# Patient Record
Sex: Male | Born: 1950
Health system: Southern US, Community
[De-identification: ages and names within clinical notes are randomized; demographics above are authoritative.]

## PROBLEM LIST (undated history)

## (undated) DIAGNOSIS — E7800 Pure hypercholesterolemia, unspecified: Secondary | ICD-10-CM

## (undated) DIAGNOSIS — I10 Essential (primary) hypertension: Secondary | ICD-10-CM

## (undated) DIAGNOSIS — R002 Palpitations: Secondary | ICD-10-CM

## (undated) DIAGNOSIS — M1990 Unspecified osteoarthritis, unspecified site: Secondary | ICD-10-CM

## (undated) HISTORY — DX: Unspecified osteoarthritis, unspecified site: M19.90

## (undated) HISTORY — DX: Palpitations: R00.2

## (undated) HISTORY — PX: NO PAST SURGERIES: SHX2092

## (undated) HISTORY — DX: Essential (primary) hypertension: I10

---

## 2001-01-15 ENCOUNTER — Encounter: Payer: Self-pay | Admitting: *Deleted

## 2001-01-15 ENCOUNTER — Emergency Department (HOSPITAL_COMMUNITY): Admission: EM | Admit: 2001-01-15 | Discharge: 2001-01-15 | Payer: Self-pay | Admitting: *Deleted

## 2001-07-12 ENCOUNTER — Ambulatory Visit (HOSPITAL_COMMUNITY): Admission: RE | Admit: 2001-07-12 | Discharge: 2001-07-12 | Payer: Self-pay | Admitting: Family Medicine

## 2001-07-12 ENCOUNTER — Encounter: Payer: Self-pay | Admitting: Family Medicine

## 2010-08-30 ENCOUNTER — Emergency Department: Payer: Self-pay | Admitting: Emergency Medicine

## 2012-09-13 ENCOUNTER — Other Ambulatory Visit (HOSPITAL_COMMUNITY): Payer: Self-pay | Admitting: Family Medicine

## 2012-09-13 ENCOUNTER — Ambulatory Visit (HOSPITAL_COMMUNITY)
Admission: RE | Admit: 2012-09-13 | Discharge: 2012-09-13 | Disposition: A | Discharge status: Home / Self Care | Payer: BC Managed Care – PPO | Source: Ambulatory Visit | Attending: Family Medicine | Admitting: Family Medicine

## 2012-09-13 DIAGNOSIS — M542 Cervicalgia: Secondary | ICD-10-CM | POA: Insufficient documentation

## 2012-09-13 DIAGNOSIS — M5030 Other cervical disc degeneration, unspecified cervical region: Secondary | ICD-10-CM | POA: Insufficient documentation

## 2012-10-04 ENCOUNTER — Other Ambulatory Visit (HOSPITAL_COMMUNITY): Payer: Self-pay | Admitting: Family Medicine

## 2012-10-04 DIAGNOSIS — I6523 Occlusion and stenosis of bilateral carotid arteries: Secondary | ICD-10-CM

## 2012-10-10 ENCOUNTER — Ambulatory Visit (HOSPITAL_COMMUNITY)
Admission: RE | Admit: 2012-10-10 | Discharge: 2012-10-10 | Disposition: A | Discharge status: Home / Self Care | Payer: BC Managed Care – PPO | Source: Ambulatory Visit | Attending: Family Medicine | Admitting: Family Medicine

## 2012-10-10 DIAGNOSIS — I6529 Occlusion and stenosis of unspecified carotid artery: Secondary | ICD-10-CM | POA: Insufficient documentation

## 2012-10-10 DIAGNOSIS — I6523 Occlusion and stenosis of bilateral carotid arteries: Secondary | ICD-10-CM

## 2016-02-11 ENCOUNTER — Emergency Department (HOSPITAL_COMMUNITY): Payer: BLUE CROSS/BLUE SHIELD

## 2016-02-11 ENCOUNTER — Encounter (HOSPITAL_COMMUNITY): Payer: Self-pay | Admitting: *Deleted

## 2016-02-11 ENCOUNTER — Emergency Department (HOSPITAL_COMMUNITY)
Admission: EM | Admit: 2016-02-11 | Discharge: 2016-02-11 | Disposition: A | Discharge status: Home / Self Care | Payer: BLUE CROSS/BLUE SHIELD | Attending: Emergency Medicine | Admitting: Emergency Medicine

## 2016-02-11 DIAGNOSIS — R5383 Other fatigue: Secondary | ICD-10-CM | POA: Diagnosis not present

## 2016-02-11 DIAGNOSIS — R110 Nausea: Secondary | ICD-10-CM | POA: Insufficient documentation

## 2016-02-11 DIAGNOSIS — Z79899 Other long term (current) drug therapy: Secondary | ICD-10-CM | POA: Insufficient documentation

## 2016-02-11 DIAGNOSIS — Z87891 Personal history of nicotine dependence: Secondary | ICD-10-CM | POA: Diagnosis not present

## 2016-02-11 DIAGNOSIS — R0789 Other chest pain: Secondary | ICD-10-CM | POA: Insufficient documentation

## 2016-02-11 HISTORY — DX: Pure hypercholesterolemia, unspecified: E78.00

## 2016-02-11 LAB — CBC WITH DIFFERENTIAL/PLATELET
BASOS ABS: 0 10*3/uL (ref 0.0–0.1)
BASOS PCT: 1 %
EOS ABS: 0.2 10*3/uL (ref 0.0–0.7)
EOS PCT: 3 %
HCT: 42.2 % (ref 39.0–52.0)
Hemoglobin: 14.8 g/dL (ref 13.0–17.0)
LYMPHS ABS: 2.2 10*3/uL (ref 0.7–4.0)
Lymphocytes Relative: 37 %
MCH: 32.2 pg (ref 26.0–34.0)
MCHC: 35.1 g/dL (ref 30.0–36.0)
MCV: 91.9 fL (ref 78.0–100.0)
Monocytes Absolute: 0.8 10*3/uL (ref 0.1–1.0)
Monocytes Relative: 13 %
NEUTROS PCT: 46 %
Neutro Abs: 2.7 10*3/uL (ref 1.7–7.7)
PLATELETS: 250 10*3/uL (ref 150–400)
RBC: 4.59 MIL/uL (ref 4.22–5.81)
RDW: 13.2 % (ref 11.5–15.5)
WBC: 5.8 10*3/uL (ref 4.0–10.5)

## 2016-02-11 LAB — BASIC METABOLIC PANEL
ANION GAP: 5 (ref 5–15)
BUN: 14 mg/dL (ref 6–20)
CALCIUM: 8.6 mg/dL — AB (ref 8.9–10.3)
CHLORIDE: 106 mmol/L (ref 101–111)
CO2: 25 mmol/L (ref 22–32)
CREATININE: 0.93 mg/dL (ref 0.61–1.24)
GFR calc non Af Amer: >60 mL/min (ref >60)
GLUCOSE: 104 mg/dL — AB (ref 65–99)
Potassium: 3.8 mmol/L (ref 3.5–5.1)
SODIUM: 136 mmol/L (ref 135–145)

## 2016-02-11 LAB — TROPONIN I
Troponin I: <0.03 ng/mL (ref <0.03)
Troponin I: <0.03 ng/mL (ref <0.03)

## 2016-02-11 MED ORDER — ASPIRIN 325 MG PO TABS
325.0000 mg | ORAL_TABLET | Freq: Once | ORAL | Status: DC
  Filled 2016-02-11: qty 1

## 2016-02-11 MED ORDER — HYDROCODONE-ACETAMINOPHEN 5-325 MG PO TABS: 1.0000 | ORAL_TABLET | Freq: Four times a day (QID) | ORAL | 0 refills | Status: DC | PRN

## 2016-02-11 MED ORDER — GI COCKTAIL ~~LOC~~
30.0000 mL | Freq: Once | ORAL | Status: AC
  Administered 2016-02-11: 30 mL via ORAL
  Filled 2016-02-11: qty 30

## 2016-02-11 MED ORDER — NITROGLYCERIN 2 % TD OINT
1.0000 [in_us] | TOPICAL_OINTMENT | Freq: Once | TRANSDERMAL | Status: AC
  Administered 2016-02-11: 1 [in_us] via TOPICAL
  Filled 2016-02-11: qty 1

## 2016-02-11 NOTE — ED Provider Notes (Signed)
Patient complains of left posterior shoulder pain. EKG and troponin 2 negative. Discharge medication Norco. He has primary care follow-up.   Donnetta HutchingBrian Kynzie Polgar, MD 02/11/16 1037

## 2016-02-11 NOTE — ED Triage Notes (Addendum)
Pt c/o left shoulder pain that woke him up x 4 hours ago; pt states he has pain in his chest and feels weak; pt c/o some nausea; pt c/o pain to big toe that radiates up is leg

## 2016-02-11 NOTE — ED Provider Notes (Signed)
[facility] Provider Note   CSN: [id number] Arrival date & time: 02/11/16  0511     History   Chief Complaint Chief Complaint  Patient presents with  . Chest Pain    HPI Anola GurneyGodofredo Abdulaziz is a 65 y.o. male.  HPI  This is a 65 year old male with a history of high cholesterol who presents with chest pain and nausea. Patient reports he woke up with pain approximately 4 hours ago. He reports chest pressure and pain between his shoulder blades. Current pain is 5 out of 10. Nothing seems to make it better or worse. Denies any recent illnesses, fevers, cough. Denies any aggravating or alleviating factors. He has had pain like this once before but "it went away on its own." No exertional component. Denies history of smoking, hypertension. Reports his brother died of a heart attack in his 6870s.  Denies shortness breath or diaphoresis  Past Medical History:  Diagnosis Date  . High cholesterol     There are no active problems to display for this patient.   History reviewed. No pertinent surgical history.     Home Medications    Prior to Admission medications   Medication Sig Start Date End Date Taking? Authorizing Provider  LORazepam (ATIVAN) 1 MG tablet Take 1 mg by mouth every 8 (eight) hours as needed for anxiety or sleep.   Yes Historical Provider, MD  LOVASTATIN PO Take by mouth.   Yes Historical Provider, MD    Family History History reviewed. No pertinent family history.  Social History Social History  Substance Use Topics  . Smoking status: Former Games developermoker  . Smokeless tobacco: Never Used  . Alcohol use Not on file     Comment: on weekends     Allergies   Aspirin   Review of Systems Review of Systems  Constitutional: Positive for fatigue. Negative for fever.  Respiratory: Negative for shortness of breath.   Cardiovascular: Positive for chest pain. Negative for leg swelling.  Gastrointestinal: Positive for nausea. Negative for abdominal pain and  vomiting.  Neurological: Negative for weakness.  All other systems reviewed and are negative.    Physical Exam Updated Vital Signs BP 137/93 (BP Location: Left Arm)   Pulse 68   Temp 98.2 F (36.8 C) (Oral)   Resp 23   Ht 5\' 4"  (1.626 m)   Wt 180 lb (81.6 kg)   SpO2 97%   BMI 30.90 kg/m   Physical Exam  Constitutional: He is oriented to person, place, and time. He appears well-developed and well-nourished. No distress.  HENT:  Head: Normocephalic and atraumatic.  Cardiovascular: Normal rate, regular rhythm and normal heart sounds.   No murmur heard. Pulmonary/Chest: Effort normal and breath sounds normal. No respiratory distress. He has no wheezes.  Abdominal: Soft. Bowel sounds are normal. There is no tenderness. There is no rebound.  Musculoskeletal: He exhibits no edema.  Neurological: He is alert and oriented to person, place, and time.  Skin: Skin is warm and dry.  Psychiatric: He has a normal mood and affect.  Nursing note and vitals reviewed.    ED Treatments / Results  Labs (all labs ordered are listed, but only abnormal results are displayed) Labs Reviewed  BASIC METABOLIC PANEL - Abnormal; Notable for the following:       Result Value   Glucose, Bld 104 (*)    Calcium 8.6 (*)    All other components within normal limits  CBC WITH DIFFERENTIAL/PLATELET  TROPONIN I    EKG  EKG Interpretation  Date/Time:  Tuesday February 11 2016 05:27:34 EDT Ventricular Rate:  69 PR Interval:    QRS Duration: 94 QT Interval:  380 QTC Calculation: 408 R Axis:   -5 Text Interpretation:  Sinus rhythm No prior for comparison Confirmed by Wilkie Aye  MD, Melenie Minniear ([id number]) on 02/11/2016 6:18:08 AM       Radiology Dg Chest 2 View  Result Date: 02/11/2016 CLINICAL DATA:  Chest pain. Awoke with left shoulder pain. Weakness. EXAM: CHEST  2 VIEW COMPARISON:  None. FINDINGS: Lung volumes are low with bibasilar atelectasis. Heart at the upper limits normal in size, likely  accentuated by low lung volumes. No pulmonary edema, pleural effusion or pneumothorax. No acute osseous abnormality. IMPRESSION: Hypoventilatory chest with bibasilar atelectasis. Electronically Signed   By: Rubye Oaks M.D.   On: 02/11/2016 06:24    Procedures Procedures (including critical care time)  Medications Ordered in ED Medications  aspirin tablet 325 mg (not administered)  gi cocktail (Maalox,Lidocaine,Donnatal) (not administered)  nitroGLYCERIN (NITROGLYN) 2 % ointment 1 inch (1 inch Topical Given 02/11/16 6045)     Initial Impression / Assessment and Plan / ED Course  I have reviewed the triage vital signs and the nursing notes.  Pertinent labs & imaging results that were available during my care of the patient were reviewed by me and considered in my medical decision making (see chart for details).  Clinical Course    Patient presents with chest pain. Somewhat atypical in nature. EKG is normal. No coronary artery disease history. Heart scores 3. Initial troponin negative. Patient given nitroglycerin and aspirin. He was subsequently also given a GI cocktail.  He has an extensive history of ulcers. Discussed with patient and his family repeat troponin and if negative, follow up with cardiology closely.  Final Clinical Impressions(s) / ED Diagnoses   Final diagnoses:  None    New Prescriptions New Prescriptions   No medications on file     Shon Baton, MD 02/11/16 2254

## 2016-02-11 NOTE — ED Notes (Addendum)
Pt states he can not take ASA due to stomach ulcers. [place] informed

## 2016-02-11 NOTE — Discharge Instructions (Addendum)
Blood test for heart attack negative times two.   Meds for pain.  Follow up your dr

## 2016-05-17 ENCOUNTER — Encounter (HOSPITAL_COMMUNITY): Payer: Self-pay | Admitting: Emergency Medicine

## 2016-05-17 ENCOUNTER — Emergency Department (HOSPITAL_COMMUNITY)
Admission: EM | Admit: 2016-05-17 | Discharge: 2016-05-17 | Disposition: A | Discharge status: Home / Self Care | Payer: BLUE CROSS/BLUE SHIELD | Attending: Emergency Medicine | Admitting: Emergency Medicine

## 2016-05-17 ENCOUNTER — Emergency Department (HOSPITAL_COMMUNITY): Payer: BLUE CROSS/BLUE SHIELD

## 2016-05-17 DIAGNOSIS — R079 Chest pain, unspecified: Secondary | ICD-10-CM | POA: Diagnosis not present

## 2016-05-17 DIAGNOSIS — Z7982 Long term (current) use of aspirin: Secondary | ICD-10-CM | POA: Insufficient documentation

## 2016-05-17 DIAGNOSIS — Z791 Long term (current) use of non-steroidal anti-inflammatories (NSAID): Secondary | ICD-10-CM | POA: Diagnosis not present

## 2016-05-17 DIAGNOSIS — Z87891 Personal history of nicotine dependence: Secondary | ICD-10-CM | POA: Diagnosis not present

## 2016-05-17 DIAGNOSIS — R002 Palpitations: Secondary | ICD-10-CM | POA: Diagnosis not present

## 2016-05-17 DIAGNOSIS — Z79899 Other long term (current) drug therapy: Secondary | ICD-10-CM | POA: Insufficient documentation

## 2016-05-17 LAB — HEPATIC FUNCTION PANEL
ALT: 44 U/L (ref 17–63)
AST: 37 U/L (ref 15–41)
Albumin: 4 g/dL (ref 3.5–5.0)
Alkaline Phosphatase: 66 U/L (ref 38–126)
BILIRUBIN INDIRECT: 0.6 mg/dL (ref 0.3–0.9)
Bilirubin, Direct: 0.4 mg/dL (ref 0.1–0.5)
TOTAL PROTEIN: 7.3 g/dL (ref 6.5–8.1)
Total Bilirubin: 1 mg/dL (ref 0.3–1.2)

## 2016-05-17 LAB — CBC
HCT: 46 % (ref 39.0–52.0)
Hemoglobin: 15.8 g/dL (ref 13.0–17.0)
MCH: 31.7 pg (ref 26.0–34.0)
MCHC: 34.3 g/dL (ref 30.0–36.0)
MCV: 92.4 fL (ref 78.0–100.0)
PLATELETS: 252 10*3/uL (ref 150–400)
RBC: 4.98 MIL/uL (ref 4.22–5.81)
RDW: 13.9 % (ref 11.5–15.5)
WBC: 5.3 10*3/uL (ref 4.0–10.5)

## 2016-05-17 LAB — BASIC METABOLIC PANEL
Anion gap: 7 (ref 5–15)
BUN: 8 mg/dL (ref 6–20)
CALCIUM: 8.8 mg/dL — AB (ref 8.9–10.3)
CO2: 26 mmol/L (ref 22–32)
CREATININE: 0.95 mg/dL (ref 0.61–1.24)
Chloride: 104 mmol/L (ref 101–111)
GFR calc Af Amer: >60 mL/min (ref >60)
Glucose, Bld: 94 mg/dL (ref 65–99)
Potassium: 4.1 mmol/L (ref 3.5–5.1)
Sodium: 137 mmol/L (ref 135–145)

## 2016-05-17 LAB — TROPONIN I

## 2016-05-17 LAB — LIPASE, BLOOD: Lipase: 16 U/L (ref 11–51)

## 2016-05-17 NOTE — ED Provider Notes (Signed)
[facility] Provider Note   CSN: [id number] Arrival date & time: 05/17/16  1056  By signing my name below, I, Bobbie Stack, attest that this documentation has been prepared under the direction and in the presence of Benjiman Core, MD. Electronically Signed: Bobbie Stack, Scribe. 05/17/16. 11:36 AM. History   Chief Complaint Chief Complaint  Patient presents with  . Chest Pain      The history is provided by the patient. No language interpreter was used.    HPI Comments: Jose Ellis is a 66 y.o. male who presents to the Emergency Department complaining of intermittent chest pain since Friday around 1pm. He thought this may be due to indigestion. He took pepcid and Catering manager which provided mild relief. He reports no change in pain when eating. He doesn't drink caffeine. Patient also reports palpitations that started last night. He states that they occur about every 30 seconds. He states that it feels like his heart skips a beat. Past Medical History:  Diagnosis Date  . High cholesterol     There are no active problems to display for this patient.   History reviewed. No pertinent surgical history.   Home Medications    Prior to Admission medications   Medication Sig Start Date End Date Taking? Authorizing Provider  acetaminophen (TYLENOL) 325 MG tablet Take 650 mg by mouth every 6 (six) hours as needed for mild pain.   Yes Historical Provider, MD  aspirin-sod bicarb-citric acid (ALKA-SELTZER) 325 MG TBEF tablet Take 325 mg by mouth every 6 (six) hours as needed (indegestion).   Yes Historical Provider, MD  HYDROcodone-acetaminophen (NORCO) 5-325 MG tablet Take 1-2 tablets by mouth every 6 (six) hours as needed for moderate pain. 02/11/16  Yes Donnetta Hutching, MD  LORazepam (ATIVAN) 1 MG tablet Take 1 mg by mouth every 8 (eight) hours as needed for anxiety or sleep.   Yes Historical Provider, MD  naproxen sodium (ANAPROX) 220 MG tablet Take 440 mg by mouth 2  (two) times daily with a meal.   Yes Historical Provider, MD  lovastatin (MEVACOR) 40 MG tablet Take 40 mg by mouth daily.     Historical Provider, MD    Family History History reviewed. No pertinent family history.  Social History Social History  Substance Use Topics  . Smoking status: Former Games developer  . Smokeless tobacco: Never Used  . Alcohol use No     Comment: on weekends     Allergies   Aspirin   Review of Systems Review of Systems  Constitutional: Negative for fever.  Cardiovascular: Positive for chest pain and palpitations.  Gastrointestinal: Negative for abdominal pain.  All other systems reviewed and are negative.   Physical Exam Updated Vital Signs BP 124/85   Pulse 71   Temp 98.1 F (36.7 C) (Oral)   Resp 20   Ht 5\' 4"  (1.626 m)   Wt 190 lb (86.2 kg)   SpO2 96%   BMI 32.61 kg/m   Physical Exam  Constitutional: He is oriented to person, place, and time. He appears well-nourished. No distress.  HENT:  Head: Normocephalic and atraumatic.  Eyes: Conjunctivae and EOM are normal. Pupils are equal, round, and reactive to light.  Neck: Normal range of motion. Neck supple. No JVD present.  Cardiovascular: Normal rate.  Exam reveals no gallop and no friction rub.   No murmur heard. Pulmonary/Chest: Effort normal and breath sounds normal. He has no wheezes. He has no rales.  Abdominal: Soft. Bowel sounds are normal. There  is no tenderness.  Musculoskeletal: Normal range of motion. He exhibits no tenderness.  Neurological: He is alert and oriented to person, place, and time.  Skin: Skin is warm and dry.  Psychiatric: He has a normal mood and affect.    ED Treatments / Results  DIAGNOSTIC STUDIES: Oxygen Saturation is 98% on RA, normal by my interpretation.    COORDINATION OF CARE: 11:40 AM Discussed treatment plan with pt at bedside and pt agreed to plan.  Labs (all labs ordered are listed, but only abnormal results are displayed) Labs Reviewed  BASIC  METABOLIC PANEL - Abnormal; Notable for the following:       Result Value   Calcium 8.8 (*)    All other components within normal limits  CBC  TROPONIN I  HEPATIC FUNCTION PANEL  LIPASE, BLOOD    EKG  EKG Interpretation  Date/Time:  Sunday May 17 2016 11:09:09 EST Ventricular Rate:  68 PR Interval:    QRS Duration: 92 QT Interval:  397 QTC Calculation: 423 R Axis:   -18 Text Interpretation:  Sinus rhythm Borderline left axis deviation Confirmed by Rubin PayorPICKERING  MD, Harrold DonathNATHAN [id number]) on 05/17/2016 12:02:46 PM       Radiology Dg Chest 2 View  Result Date: 05/17/2016 CLINICAL DATA:  Chest pain and palpitations since last night, former smoker EXAM: CHEST  2 VIEW COMPARISON:  02/11/2016 FINDINGS: Upper normal heart size. Mediastinal contours and pulmonary vascularity normal. Eventration of anterior RIGHT diaphragm stable. No acute infiltrate, pleural effusion, or pneumothorax. Bones unremarkable. IMPRESSION: No acute abnormalities. Electronically Signed   By: Ulyses SouthwardMark  Boles M.D.   On: 05/17/2016 12:09    Procedures Procedures (including critical care time)  Medications Ordered in ED Medications - No data to display   Initial Impression / Assessment and Plan / ED Course  I have reviewed the triage vital signs and the nursing notes.  Pertinent labs & imaging results that were available during my care of the patient were reviewed by me and considered in my medical decision making (see chart for details).  Clinical Course     Patient presents with palpitations and chest pain. Has been looked at before without cause. Has dull chest pain. EKG and lab work reassuring. Did have some occasional PVCs on EKG. Labs reassuring. X-ray reassuring. Discharge home to follow-up with PCP.  Final Clinical Impressions(s) / ED Diagnoses   Final diagnoses:  Nonspecific chest pain  Palpitations    New Prescriptions New Prescriptions   No medications on file   I personally performed the  services described in this documentation, which was scribed in my presence. The recorded information has been reviewed and is accurate.      Benjiman CoreNathan Jamilynn Whitacre, MD 05/17/16 [phone]

## 2016-05-17 NOTE — ED Triage Notes (Signed)
Pt states last night he began having palpitations intermittently. Hx of this before and was evaluated did not find a source. States chest "discomfort".

## 2016-06-26 ENCOUNTER — Ambulatory Visit (INDEPENDENT_AMBULATORY_CARE_PROVIDER_SITE_OTHER): Payer: BLUE CROSS/BLUE SHIELD | Admitting: Cardiology

## 2016-06-26 ENCOUNTER — Encounter: Payer: Self-pay | Admitting: Cardiology

## 2016-06-26 VITALS — BP 110/76 | HR 67 | Ht 65.0 in | Wt 194.0 lb

## 2016-06-26 DIAGNOSIS — R002 Palpitations: Secondary | ICD-10-CM

## 2016-06-26 NOTE — Progress Notes (Signed)
Clinical Summary Jose Ellis is a 66 y.o.male seen as a new patient. He is referred by Dr Jose Ellis.   1. Chest pain - reports 2 total episodes of chest pain and palpitations over the last few mwonths.  -most recently seen in ER 05/17/16 for symptoms.  - symptoms started around 11pm feeling discomfort left chest, heaviness left sided. Mild pain. Mild SOB, +palpitations. - seen by nurse friend, thought to be having an irregular rhythm -symptoms worst with iced tea.  - in ER noted to have some occasional PVCs     SH: husband of Jose Ellis, also a patient of mine   Past Medical History:  Diagnosis Date  . High cholesterol      Allergies  Allergen Reactions  . Aspirin     Pt states it burns his stomach     Current Outpatient Prescriptions  Medication Sig Dispense Refill  . acetaminophen (TYLENOL) 325 MG tablet Take 650 mg by mouth every 6 (six) hours as needed for mild pain.    [name] aspirin-sod bicarb-citric acid (ALKA-SELTZER) 325 MG TBEF tablet Take 325 mg by mouth every 6 (six) hours as needed (indegestion).    [name] HYDROcodone-acetaminophen (NORCO) 5-325 MG tablet Take 1-2 tablets by mouth every 6 (six) hours as needed for moderate pain. 20 tablet 0  . LORazepam (ATIVAN) 1 MG tablet Take 1 mg by mouth every 8 (eight) hours as needed for anxiety or sleep.    [name] lovastatin (MEVACOR) 40 MG tablet Take 40 mg by mouth daily.     . naproxen sodium (ANAPROX) 220 MG tablet Take 440 mg by mouth 2 (two) times daily with a meal.     No current facility-administered medications for this visit.      No past surgical history on file.   Allergies  Allergen Reactions  . Aspirin     Pt states it burns his stomach      No family history on file.   Social History [name] reports that he has quit smoking. He has never used smokeless tobacco. [name] reports that he does not drink alcohol.   Review of Systems CONSTITUTIONAL: No weight loss, fever, chills,  weakness or fatigue.  HEENT: Eyes: No visual loss, blurred vision, double vision or yellow sclerae.No hearing loss, sneezing, congestion, runny nose or sore throat.  SKIN: No rash or itching.  CARDIOVASCULAR: per HPI RESPIRATORY: No shortness of breath, cough or sputum.  GASTROINTESTINAL: No anorexia, nausea, vomiting or diarrhea. No abdominal pain or blood.  GENITOURINARY: No burning on urination, no polyuria NEUROLOGICAL: No headache, dizziness, syncope, paralysis, ataxia, numbness or tingling in the extremities. No change in bowel or bladder control.  MUSCULOSKELETAL: No muscle, back pain, joint pain or stiffness.  LYMPHATICS: No enlarged nodes. No history of splenectomy.  PSYCHIATRIC: No history of depression or anxiety.  ENDOCRINOLOGIC: No reports of sweating, cold or heat intolerance. No polyuria or polydipsia.  [name]   Physical Examination Vitals:   06/26/16 1342 06/26/16 1345  BP: 102/70 110/76  Pulse: 67    Vitals:   06/26/16 1342  Weight: 194 lb (88 kg)  Height: 5\' 5"  (1.651 m)    Gen: resting comfortably, no acute distress HEENT: no scleral icterus, pupils equal round and reactive, no palptable cervical adenopathy,  CV: RRR, no m/r/g, no jvd Resp: Clear to auscultation bilaterally GI: abdomen is soft, non-tender, non-distended, normal bowel sounds, no hepatosplenomegaly MSK: extremities are warm, no edema.  Skin: warm, no rash Neuro:  no focal deficits Psych: appropriate affect     Assessment and Plan  1. Chest pain/palpitations - symptoms most consistent with symptomatic arrhythmia. EKG in clinic today SR.  - we will obtain an event monitor to further evaluate - counseled to low caffeine intake.    F/u 1 month   Antoine PocheJonathan F. Ailen Strauch, M.D.

## 2016-06-26 NOTE — Patient Instructions (Signed)
Your physician recommends that you schedule a follow-up appointment in: 1 month    Your physician has recommended that you wear an event monitor. Event monitors are medical devices that record the heart's electrical activity. Doctors most often us these monitors to diagnose arrhythmias. Arrhythmias are problems with the speed or rhythm of the heartbeat. The monitor is a small, portable device. You can wear one while you do your normal daily activities. This is usually used to diagnose what is causing palpitations/syncope (passing out).     Your physician recommends that you continue on your current medications as directed. Please refer to the Current Medication list given to you today.     Thank you for choosing [facility] !

## 2016-07-22 ENCOUNTER — Telehealth: Payer: Self-pay | Admitting: Cardiology

## 2016-07-22 NOTE — Telephone Encounter (Signed)
Pt's daughter wanted results for cardiac event monitor. Was mailed back last week. Let her know as soon as the results are made available I would let her know. I will call [employer] to see the delay in the end of service.

## 2016-07-22 NOTE — Telephone Encounter (Signed)
Results of event monitor / tg  °

## 2016-07-23 NOTE — Telephone Encounter (Signed)
Spoke with niece. Pt will come in tomorrow and have monitor placed by office staff.

## 2016-07-23 NOTE — Telephone Encounter (Signed)
Monitor needs to be rearranged. Please explain to patient and his daughter what happened   Dominga FerryJ Branch MD

## 2016-07-23 NOTE — Telephone Encounter (Signed)
Spoke with [employer]. They state they were unable to reach pt after multiple tries in order to obtain a baseline. Following their protocol [employer] then canceled the monitoring services. Pt has since returned the monitor.

## 2016-07-24 ENCOUNTER — Encounter (INDEPENDENT_AMBULATORY_CARE_PROVIDER_SITE_OTHER): Payer: BLUE CROSS/BLUE SHIELD

## 2016-07-24 DIAGNOSIS — R002 Palpitations: Secondary | ICD-10-CM

## 2016-08-12 ENCOUNTER — Telehealth: Payer: Self-pay | Admitting: Cardiology

## 2016-08-12 NOTE — Telephone Encounter (Signed)
Patient's daughter wants to confirm that we received resultsof patient's monitor. / tg

## 2016-08-14 ENCOUNTER — Encounter: Payer: Self-pay | Admitting: Cardiology

## 2016-08-14 ENCOUNTER — Ambulatory Visit (INDEPENDENT_AMBULATORY_CARE_PROVIDER_SITE_OTHER): Payer: BLUE CROSS/BLUE SHIELD | Admitting: Cardiology

## 2016-08-14 VITALS — BP 100/60 | HR 64 | Ht 65.0 in | Wt 186.0 lb

## 2016-08-14 DIAGNOSIS — R002 Palpitations: Secondary | ICD-10-CM

## 2016-08-14 DIAGNOSIS — R0789 Other chest pain: Secondary | ICD-10-CM | POA: Diagnosis not present

## 2016-08-14 MED ORDER — METOPROLOL TARTRATE 25 MG PO TABS: 12.5000 mg | ORAL_TABLET | Freq: Two times a day (BID) | ORAL | 3 refills | Status: DC

## 2016-08-14 NOTE — Patient Instructions (Signed)
Medication Instructions:  Start lopressor 12.5 mg every 12 hours as needed for palpitations  Labwork: none  Testing/Procedures: none  Follow-Up: Your physician wants you to follow-up in: 6 months .  You will receive a reminder letter in the mail two months in advance. If you don't receive a letter, please call our office to schedule the follow-up appointment.   Any Other Special Instructions Will Be Listed Below (If Applicable).     If you need a refill on your cardiac medications before your next appointment, please call your pharmacy.

## 2016-08-14 NOTE — Progress Notes (Signed)
Clinical Summary Jose Ellis is a 66 y.o.male seen today for follow up of the following medical problems.   1. Chest pain - reports 2 total episodes of chest pain and palpitations over the last few mwonths.  -most recently seen in ER 05/17/16 for symptoms.  - symptoms started around 11pm feeling discomfort left chest, heaviness left sided. Mild pain. Mild SOB, +palpitations. - seen by nurse friend, thought to be having an irregular rhythm -symptoms worst with iced tea.  - in ER noted to have some occasional PVCs - better - episode occurred after cutting grass. Difficult to describe pain, mild in severity. Lasted about 30 minutes. Can occur after drinking caffeine. Better with ativan. Can be stensitive to palpation  CAD risk fctorss; HL, former smoker x 25 years,  quit 20 years, brother MI 65 - since last visit completed heart monitor that was benign.     SH: husband of Jose Ellis, also a patient of mine Past Medical History:  Diagnosis Date  . Arthritis   . High cholesterol      Allergies  Allergen Reactions  . Aspirin     Pt states it burns his stomach     Current Outpatient Prescriptions  Medication Sig Dispense Refill  . acetaminophen (TYLENOL) 325 MG tablet Take 650 mg by mouth every 6 (six) hours as needed for mild pain.    [name] aspirin-sod bicarb-citric acid (ALKA-SELTZER) 325 MG TBEF tablet Take 325 mg by mouth every 6 (six) hours as needed (indegestion).    [name] HYDROcodone-acetaminophen (NORCO) 5-325 MG tablet Take 1-2 tablets by mouth every 6 (six) hours as needed for moderate pain. 20 tablet 0  . LORazepam (ATIVAN) 1 MG tablet Take 1 mg by mouth every 8 (eight) hours as needed for anxiety or sleep.    [name] lovastatin (MEVACOR) 40 MG tablet Take 40 mg by mouth daily.     . naproxen sodium (ANAPROX) 220 MG tablet Take 440 mg by mouth 2 (two) times daily with a meal.     No current facility-administered medications for this visit.      No past surgical history  on file.   Allergies  Allergen Reactions  . Aspirin     Pt states it burns his stomach      Family History  Problem Relation Age of Onset  . Heart attack Brother      Social History [name] reports that he has quit smoking. He has never used smokeless tobacco. [name] reports that he does not drink alcohol.   Review of Systems CONSTITUTIONAL: No weight loss, fever, chills, weakness or fatigue.  HEENT: Eyes: No visual loss, blurred vision, double vision or yellow sclerae.No hearing loss, sneezing, congestion, runny nose or sore throat.  SKIN: No rash or itching.  CARDIOVASCULAR: per HPI RESPIRATORY: No shortness of breath, cough or sputum.  GASTROINTESTINAL: No anorexia, nausea, vomiting or diarrhea. No abdominal pain or blood.  GENITOURINARY: No burning on urination, no polyuria NEUROLOGICAL: No headache, dizziness, syncope, paralysis, ataxia, numbness or tingling in the extremities. No change in bowel or bladder control.  MUSCULOSKELETAL: No muscle, back pain, joint pain or stiffness.  LYMPHATICS: No enlarged nodes. No history of splenectomy.  PSYCHIATRIC: No history of depression or anxiety.  ENDOCRINOLOGIC: No reports of sweating, cold or heat intolerance. No polyuria or polydipsia.  [name]   Physical Examination Vitals:   08/14/16 1300 08/14/16 1310  BP: (!) 83/58 100/60  Pulse: 62 64   Vitals:  08/14/16 1300  Weight: 186 lb (84.4 kg)  Height:  (1.651 m)    Gen: resting comfortably, no acute distress HEENT: no scleral icterus, pupils equal round and reactive, no palptable cervical adenopathy,  CV: RRR, no m/rg, no jvd Resp: Clear to auscultation bilaterally GI: abdomen is soft, non-tender, non-distended, normal bowel sounds, no hepatosplenomegaly MSK: extremities are warm, no edema.  Skin: warm, no rash Neuro:  no focal deficits Psych: appropriate affect   Diagnostic Studies  07/2016 Event monitor  Telemetry tracings show sinus rhythm  Min  HR 43, Max HR 113, Avg HR 63  No symptoms reported  No significant arrhythmias   Assessment and Plan   1. Chest pain/palpitations - negative event monitor. Very atypical chest pain. Given his CAD risk factors we discussed a possible stress test however he is not in favor at this time. Given how atypical his symptoms are I think this is reasonable decision, and something to just monitor at this time - start prn lopressor for palpitations.   F/u 6 months.       Antoine Poche, M.D.

## 2017-01-27 IMAGING — DX DG CHEST 2V
2 series · 2 of 2 positions shown · non-contrast
Comparison: 02/11/2016

CLINICAL DATA: Chest pain and palpitations since last night, former
smoker

EXAM:
CHEST  2 VIEW

[chest pa]
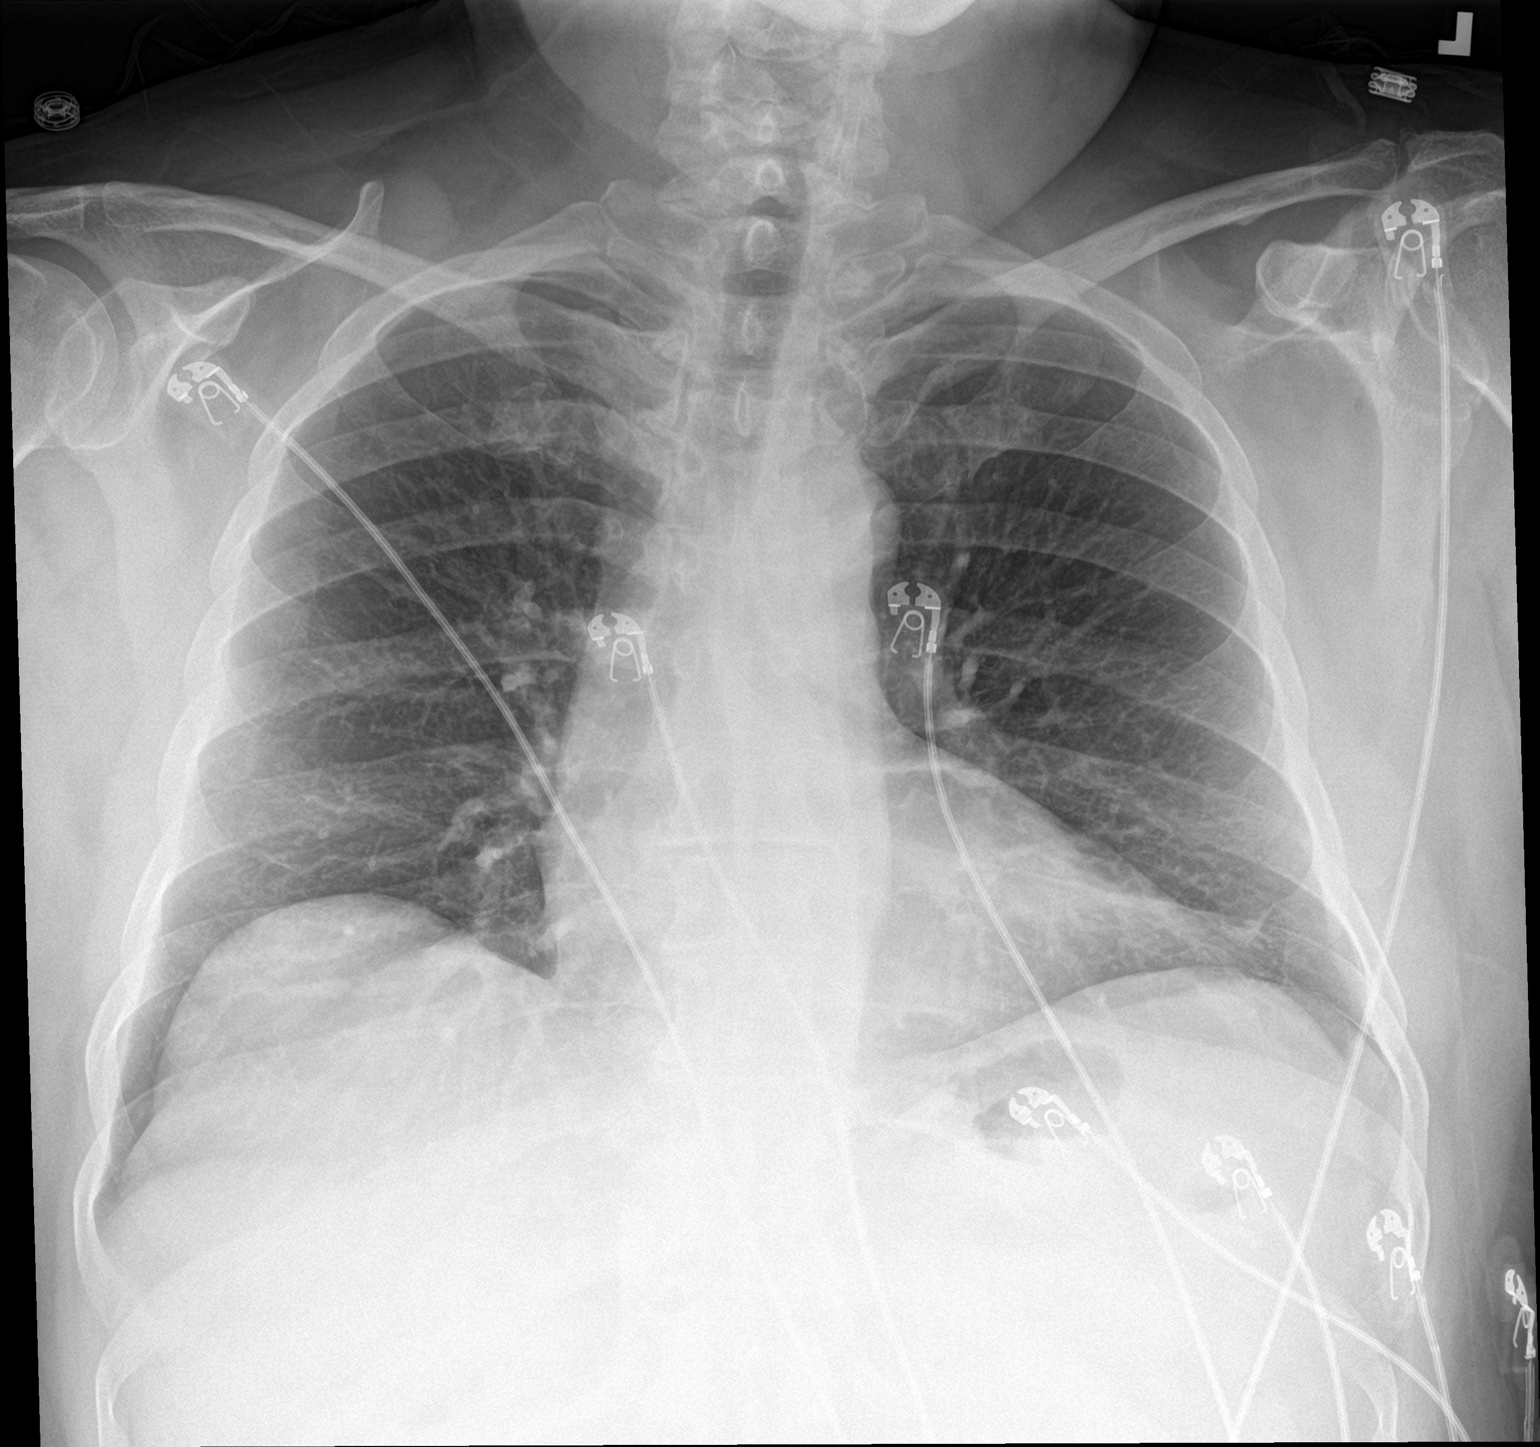

[chest lat]
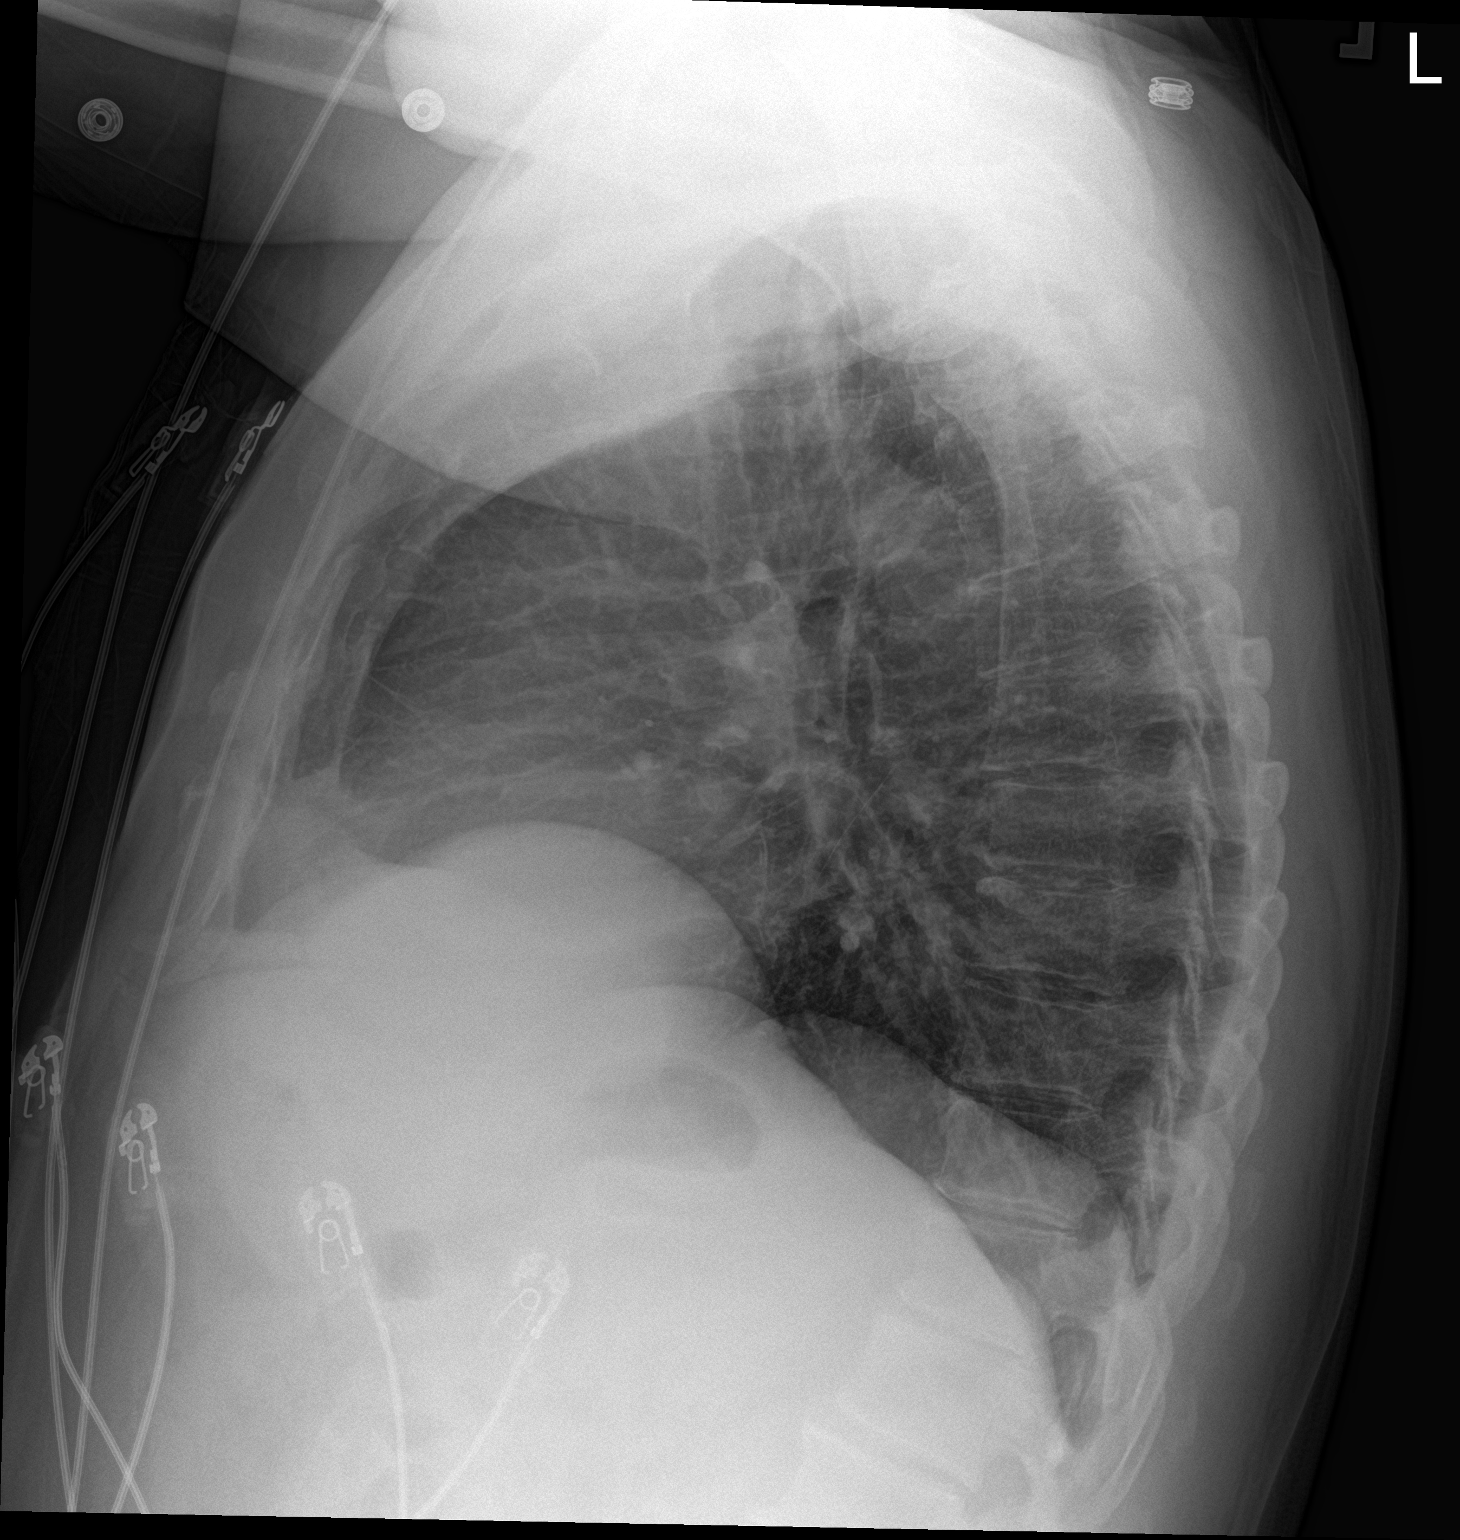

[2 of 2 positions shown; findings below may reference images not displayed]

FINDINGS: Upper normal heart size.

Mediastinal contours and pulmonary vascularity normal.

Eventration of anterior RIGHT diaphragm stable.

No acute infiltrate, pleural effusion, or pneumothorax.

Bones unremarkable.
IMPRESSION: No acute abnormalities.

## 2018-05-18 DIAGNOSIS — E6609 Other obesity due to excess calories: Secondary | ICD-10-CM | POA: Diagnosis not present

## 2018-05-18 DIAGNOSIS — J301 Allergic rhinitis due to pollen: Secondary | ICD-10-CM | POA: Diagnosis not present

## 2018-05-18 DIAGNOSIS — G4700 Insomnia, unspecified: Secondary | ICD-10-CM | POA: Diagnosis not present

## 2018-05-18 DIAGNOSIS — E782 Mixed hyperlipidemia: Secondary | ICD-10-CM | POA: Diagnosis not present

## 2018-09-12 ENCOUNTER — Telehealth: Payer: Self-pay | Admitting: Student

## 2018-09-12 NOTE — Telephone Encounter (Signed)
Virtual Visit Pre-Appointment Phone Call  "(Name), I am calling you today to discuss your upcoming appointment. We are currently trying to limit exposure to the virus that causes COVID-19 by seeing patients at home rather than in the office."  1. "What is the BEST phone number to call the day of the visit?" - include this in appointment notes  2. Do you have or have access to (through a family member/friend) a smartphone with video capability that we can use for your visit?" a. If yes - list this number in appt notes as cell (if different from BEST phone #) and list the appointment type as a VIDEO visit in appointment notes b. If no - list the appointment type as a PHONE visit in appointment notes  3. Confirm consent - "In the setting of the current Covid19 crisis, you are scheduled for a (phone or video) visit with your provider on (date) at (time).  Just as we do with many in-office visits, in order for you to participate in this visit, we must obtain consent.  If you'd like, I can send this to your mychart (if signed up) or email for you to review.  Otherwise, I can obtain your verbal consent now.  All virtual visits are billed to your insurance company just like a normal visit would be.  By agreeing to a virtual visit, we'd like you to understand that the technology does not allow for your provider to perform an examination, and thus may limit your provider's ability to fully assess your condition. If your provider identifies any concerns that need to be evaluated in person, we will make arrangements to do so.  Finally, though the technology is pretty good, we cannot assure that it will always work on either your or our end, and in the setting of a video visit, we may have to convert it to a phone-only visit.  In either situation, we cannot ensure that we have a secure connection.  Are you willing to proceed?" STAFF: Did the patient verbally acknowledge consent to telehealth visit? Document  YES/NO here: yes  4. Advise patient to be prepared - "Two hours prior to your appointment, go ahead and check your blood pressure, pulse, oxygen saturation, and your weight (if you have the equipment to check those) and write them all down. When your visit starts, your provider will ask you for this information. If you have an Apple Watch or Kardia device, please plan to have heart rate information ready on the day of your appointment. Please have a pen and paper handy nearby the day of the visit as well."  5. Give patient instructions for MyChart download to smartphone OR Doximity/Doxy.me as below if video visit (depending on what platform provider is using)  6. Inform patient they will receive a phone call 15 minutes prior to their appointment time (may be from unknown caller ID) so they should be prepared to answer    TELEPHONE CALL NOTE  Jose Ellis has been deemed a candidate for a follow-up tele-health visit to limit community exposure during the Covid-19 pandemic. I spoke with the patient via phone to ensure availability of phone/video source, confirm preferred email & phone number, and discuss instructions and expectations.  I reminded Jose Ellis to be prepared with any vital sign and/or heart rhythm information that could potentially be obtained via home monitoring, at the time of his visit. I reminded Jose Ellis to expect a phone call prior to his visit.  Geraldine Contras 09/12/2018 4:38 PM   INSTRUCTIONS FOR DOWNLOADING THE MYCHART APP TO SMARTPHONE  - The patient must first make sure to have activated MyChart and know their login information - If Apple, go to [employer] and type in MyChart in the search bar and download the app. If Android, ask patient to go to [employer] and type in [name] in the search bar and download the app. The app is free but as with any other app downloads, their phone may require them to verify saved payment information or  Apple/Android password.  - The patient will need to then log into the app with their MyChart username and password, and select [facility] as their healthcare provider to link the account. When it is time for your visit, go to the MyChart app, find appointments, and click Begin Video Visit. Be sure to Select Allow for your device to access the Microphone and Camera for your visit. You will then be connected, and your provider will be with you shortly.  **If they have any issues connecting, or need assistance please contact MyChart service desk [phone])**  **If using a computer, in order to ensure the best quality for their visit they will need to use either of the following Internet Browsers: [employer] or Google Chrome**  IF USING DOXIMITY or DOXY.ME - The patient will receive a link just prior to their visit by text.     FULL LENGTH CONSENT FOR TELE-HEALTH VISIT   I hereby voluntarily request, consent and authorize [facility] and its employed or contracted physicians, physician assistants, nurse practitioners or other licensed health care professionals (the Practitioner), to provide me with telemedicine health care services (the Services") as deemed necessary by the treating Practitioner. I acknowledge and consent to receive the Services by the Practitioner via telemedicine. I understand that the telemedicine visit will involve communicating with the Practitioner through live audiovisual communication technology and the disclosure of certain medical information by electronic transmission. I acknowledge that I have been given the opportunity to request an in-person assessment or other available alternative prior to the telemedicine visit and am voluntarily participating in the telemedicine visit.  I understand that I have the right to withhold or withdraw my consent to the use of telemedicine in the course of my care at any time, without affecting my right to future care  or treatment, and that the Practitioner or I may terminate the telemedicine visit at any time. I understand that I have the right to inspect all information obtained and/or recorded in the course of the telemedicine visit and may receive copies of available information for a reasonable fee.  I understand that some of the potential risks of receiving the Services via telemedicine include:   Delay or interruption in medical evaluation due to technological equipment failure or disruption;  Information transmitted may not be sufficient (e.g. poor resolution of images) to allow for appropriate medical decision making by the Practitioner; and/or   In rare instances, security protocols could fail, causing a breach of personal health information.  Furthermore, I acknowledge that it is my responsibility to provide information about my medical history, conditions and care that is complete and accurate to the best of my ability. I acknowledge that Practitioner's advice, recommendations, and/or decision may be based on factors not within their control, such as incomplete or inaccurate data provided by me or distortions of diagnostic images or specimens that may result from electronic transmissions. I understand that the  practice of medicine is not an Chief Strategy Officer and that Practitioner makes no warranties or guarantees regarding treatment outcomes. I acknowledge that I will receive a copy of this consent concurrently upon execution via email to the email address I last provided but may also request a printed copy by calling the office of [facility].    I understand that my insurance will be billed for this visit.   I have read or had this consent read to me.  I understand the contents of this consent, which adequately explains the benefits and risks of the Services being provided via telemedicine.   I have been provided ample opportunity to ask questions regarding this consent and the Services and have had  my questions answered to my satisfaction.  I give my informed consent for the services to be provided through the use of telemedicine in my medical care  By participating in this telemedicine visit I agree to the above.

## 2018-09-15 ENCOUNTER — Telehealth (INDEPENDENT_AMBULATORY_CARE_PROVIDER_SITE_OTHER): Payer: PPO | Admitting: Student

## 2018-09-15 ENCOUNTER — Encounter: Payer: Self-pay | Admitting: Student

## 2018-09-15 VITALS — BP 111/70 | HR 59 | Ht 64.0 in | Wt 180.0 lb

## 2018-09-15 DIAGNOSIS — R002 Palpitations: Secondary | ICD-10-CM | POA: Diagnosis not present

## 2018-09-15 DIAGNOSIS — E785 Hyperlipidemia, unspecified: Secondary | ICD-10-CM

## 2018-09-15 DIAGNOSIS — Z7189 Other specified counseling: Secondary | ICD-10-CM

## 2018-09-15 MED ORDER — METOPROLOL TARTRATE 25 MG PO TABS: 12.5000 mg | ORAL_TABLET | Freq: Two times a day (BID) | ORAL | 3 refills | Status: DC

## 2018-09-15 NOTE — Patient Instructions (Signed)
Medication Instructions:  Your physician recommends that you continue on your current medications as directed. Please refer to the Current Medication list given to you today.  Take Lopressor 12.5 mg Two Times a day as needed for palpitations   Labwork: NONE  Testing/Procedures: NONE  Follow-Up: Your physician wants you to follow-up in: 6 Months with Dr. Wyline Mood in the [place]. You will receive a reminder letter in the mail two months in advance. If you don't receive a letter, please call our office to schedule the follow-up appointment.   Any Other Special Instructions Will Be Listed Below (If Applicable).     If you need a refill on your cardiac medications before your next appointment, please call your pharmacy.  Thank you for choosing [facility]!

## 2018-09-15 NOTE — Progress Notes (Signed)
Virtual Visit via Video Note   This visit type was conducted due to national recommendations for restrictions regarding the COVID-19 Pandemic (e.g. social distancing) in an effort to limit this patient's exposure and mitigate transmission in our community.  Due to his co-morbid illnesses, this patient is at least at moderate risk for complications without adequate follow up.  This format is felt to be most appropriate for this patient at this time.  All issues noted in this document were discussed and addressed.  A limited physical exam was performed with this format.  Please refer to the patient's chart for his consent to telehealth for [facility].   Date:  09/15/2018   ID:  Jose Ellis, DOB [birth year], MRN [id number]  Patient Location: Home Provider Location: Home  PCP:  Gareth Morgan, MD  Cardiologist:  Dina Rich, MD  Electrophysiologist:  None   Evaluation Performed:  Follow-Up Visit  Chief Complaint:  Recent Palpitations  History of Present Illness:    Jose Ellis is a 68 y.o. male with past medical history of HLD, palpitations (PVC's by prior monitoring), family history of CAD, and prior tobacco use who presents for an overdue 62-month telehealth visit.   He was last examined by Dr. Wyline Mood in 08/2016 and reported occasional episodes of chest pain which he described associated palpitations with. Symptoms were typically worse with caffeine consumption and improved with the use of PRN Ativan. Recent event monitor showed no significant arrhythmias and he was started on PRN Lopressor. Given his cardiac risk factors, a stress test was reviewed at that time but he declined.   In talking with the patient and his daughter today, he reports overall doing well until this past Sunday. That evening, he developed palpitations and could feel his pulse skipping, lasting for 10-15 minutes. HR was normal at that time and he denies any tachy-palpitations. No associated chest pain,  dyspnea, dizziness, or presyncope. Does report being under increased stress given the Coronavirus and is concerned about his family getting sick. No known sick exposures at this time. He does consume 2-3 sodas per day but this is unchanged. Denies any alcohol use. He did take his expired Lopressor and this helped with his symptoms.   He denies any recent chest pain or dyspnea on exertion when performing his routine activities. No recent orthopnea, PND, or lower extremity edema  The patient does not have symptoms concerning for COVID-19 infection (fever, chills, cough, or new shortness of breath).    Past Medical History:  Diagnosis Date  . Arthritis   . Heart palpitations   . High cholesterol    Past Surgical History:  Procedure Laterality Date  . NO PAST SURGERIES       Current Meds  Medication Sig  . acetaminophen (TYLENOL) 325 MG tablet Take 650 mg by mouth every 6 (six) hours as needed for mild pain.  [name] aspirin-sod bicarb-citric acid (ALKA-SELTZER) 325 MG TBEF tablet Take 325 mg by mouth every 6 (six) hours as needed (indegestion).  . LORazepam (ATIVAN) 1 MG tablet Take 1 mg by mouth every 8 (eight) hours as needed for anxiety or sleep.  [name] lovastatin (MEVACOR) 40 MG tablet Take 40 mg by mouth daily.   . metoprolol tartrate (LOPRESSOR) 25 MG tablet Take 0.5 tablets (12.5 mg total) by mouth 2 (two) times daily. Take 12.5 mg every 12 hours as needed for palpitations.  . naproxen sodium (ANAPROX) 220 MG tablet Take 440 mg by mouth 2 (two) times daily with a meal.  . [  DISCONTINUED] metoprolol tartrate (LOPRESSOR) 25 MG tablet Take 0.5 tablets (12.5 mg total) by mouth 2 (two) times daily. Take 12.5 mg every 12 hours as needed for palpitations.     Allergies:   Aspirin and Penicillins   Social History   Tobacco Use  . Smoking status: Former Games developermoker  . Smokeless tobacco: Never Used  Substance Use Topics  . Alcohol use: No    Comment: on weekends  . Drug use: No     Family Hx:  The patient's family history includes Heart attack in his brother.  ROS:   Please see the history of present illness.     All other systems reviewed and are negative.   Prior CV studies:   The following studies were reviewed today:  Event Monitor: 07/2016  Telemetry tracings show sinus rhythm  Min HR 43, Max HR 113, Avg HR 63  No symptoms reported  No significant arrhythmias    Labs/Other Tests and Data Reviewed:    EKG:  No ECG reviewed.  Recent Labs: No results found for requested labs within last 8760 hours.   Recent Lipid Panel No results found for: CHOL, TRIG, HDL, CHOLHDL, LDLCALC, LDLDIRECT  Wt Readings from Last 3 Encounters:  09/15/18 180 lb (81.6 kg)  08/14/16 186 lb (84.4 kg)  06/26/16 194 lb (88 kg)     Objective:    Vital Signs:  BP 111/70   Pulse (!) 59   Ht 5\' 4"  (1.626 m)   Wt 180 lb (81.6 kg)   BMI 30.90 kg/m    General: Pleasant male appearing in NAD Psych: Normal affect. Neuro: Alert and oriented X 3. Moves all extremities spontaneously. HEENT: Normal in appearance  Lungs:  Resp regular and unlabored by video exam.   ASSESSMENT & PLAN:    1. Palpitations - the patient reports having an episode of palpitaions several days ago which lasted for 10-15 minutes and improved when he used his expired Lopressor. Prior monitor showed no significant arrhythmias but he has been noted to have PVC's when connected to telemetry while in the ED.  - given no associated symptoms and his brief episode, will continue with conservative measures at this time. An updated Rx for his Lopressor was sent in. I reviewed with the patient and his daughter that if his episodes become more frequent or he develops tachy-palpitations, to make us aware as a repeat cardiac event monitor could be arranged at that time. If worsening symptoms in the future, would also make sure his labs are up-to-date (CBC, BMET, Mg, and TSH) and consider an echocardiogram to assess for  structural abnormalities.   2. HLD - followed by PCP. No recent labs available for review in Epic. Remains on Lovastatin 40mg  daily.   3. COVID-19 Education - The signs and symptoms of COVID-19 were discussed with the patient. The importance of social distancing was discussed today.  Time:   Today, I have spent 16 minutes with the patient with telehealth technology discussing the above problems.     Medication Adjustments/Labs and Tests Ordered: Current medicines are reviewed at length with the patient today.  Concerns regarding medicines are outlined above.   Tests Ordered: No orders of the defined types were placed in this encounter.   Medication Changes: Meds ordered this encounter  Medications  . metoprolol tartrate (LOPRESSOR) 25 MG tablet    Sig: Take 0.5 tablets (12.5 mg total) by mouth 2 (two) times daily. Take 12.5 mg every 12 hours as needed for palpitations.  Dispense:  90 tablet    Refill:  3    Order Specific Question:   Supervising Provider    Answer:   Lars Masson [[id number]]    Disposition:  Follow up with Dr. Wyline Mood in 6 months.   Signed, Ellsworth Lennox, PA-C  09/15/2018 5:12 PM    [facility]

## 2018-11-09 DIAGNOSIS — L84 Corns and callosities: Secondary | ICD-10-CM | POA: Diagnosis not present

## 2018-11-09 DIAGNOSIS — M545 Low back pain: Secondary | ICD-10-CM | POA: Diagnosis not present

## 2018-11-09 DIAGNOSIS — E782 Mixed hyperlipidemia: Secondary | ICD-10-CM | POA: Diagnosis not present

## 2018-11-09 DIAGNOSIS — Z7189 Other specified counseling: Secondary | ICD-10-CM | POA: Diagnosis not present

## 2018-11-17 DIAGNOSIS — E782 Mixed hyperlipidemia: Secondary | ICD-10-CM | POA: Diagnosis not present

## 2018-11-17 DIAGNOSIS — E6609 Other obesity due to excess calories: Secondary | ICD-10-CM | POA: Diagnosis not present

## 2018-11-17 DIAGNOSIS — G4726 Circadian rhythm sleep disorder, shift work type: Secondary | ICD-10-CM | POA: Diagnosis not present

## 2018-11-17 DIAGNOSIS — I959 Hypotension, unspecified: Secondary | ICD-10-CM | POA: Diagnosis not present

## 2019-02-07 DIAGNOSIS — M79675 Pain in left toe(s): Secondary | ICD-10-CM | POA: Diagnosis not present

## 2019-02-07 DIAGNOSIS — H6692 Otitis media, unspecified, left ear: Secondary | ICD-10-CM | POA: Diagnosis not present

## 2019-05-12 DIAGNOSIS — E782 Mixed hyperlipidemia: Secondary | ICD-10-CM | POA: Diagnosis not present

## 2019-05-12 DIAGNOSIS — J309 Allergic rhinitis, unspecified: Secondary | ICD-10-CM | POA: Diagnosis not present

## 2019-05-12 DIAGNOSIS — F419 Anxiety disorder, unspecified: Secondary | ICD-10-CM | POA: Diagnosis not present

## 2019-05-12 DIAGNOSIS — R002 Palpitations: Secondary | ICD-10-CM | POA: Diagnosis not present

## 2019-06-18 ENCOUNTER — Ambulatory Visit: Payer: PPO | Attending: Internal Medicine

## 2019-06-18 ENCOUNTER — Other Ambulatory Visit: Payer: Self-pay

## 2019-06-18 DIAGNOSIS — Z23 Encounter for immunization: Secondary | ICD-10-CM | POA: Insufficient documentation

## 2019-06-18 NOTE — Progress Notes (Signed)
   Covid-19 Vaccination Clinic  Name:  Hilton Saephan    MRN: [id number] DOB: 07-Apr-1951  06/18/2019  Mr. Diemer was observed post Covid-19 immunization for 15 minutes without incidence. He was provided with Vaccine Information Sheet and instruction to access the V-Safe system.   Mr. Sevillano was instructed to call 911 with any severe reactions post vaccine: [name] Difficulty breathing  . Swelling of your face and throat  . A fast heartbeat  . A bad rash all over your body  . Dizziness and weakness    Immunizations Administered    Name Date Dose VIS Date Route   Moderna COVID-19 Vaccine 06/18/2019  1:26 PM 0.5 mL 04/04/2019 Intramuscular   Manufacturer: Moderna   Lot: [id number]   NDC: 40982-867-51

## 2019-07-16 ENCOUNTER — Ambulatory Visit: Payer: PPO | Attending: Internal Medicine

## 2019-07-16 DIAGNOSIS — Z23 Encounter for immunization: Secondary | ICD-10-CM

## 2019-07-16 NOTE — Progress Notes (Signed)
   Covid-19 Vaccination Clinic  Name:  Jose Ellis    MRN: [id number] DOB: 10-01-1950  07/16/2019  Mr. Jose Ellis was observed post Covid-19 immunization for 15 minutes without incident. He was provided with Vaccine Information Sheet and instruction to access the V-Safe system.   Mr. Jose Ellis was instructed to call 911 with any severe reactions post vaccine: [name] Difficulty breathing  . Swelling of face and throat  . A fast heartbeat  . A bad rash all over body  . Dizziness and weakness   Immunizations Administered    Name Date Dose VIS Date Route   Moderna COVID-19 Vaccine 07/16/2019  1:28 PM 0.5 mL 04/04/2019 Intramuscular   Manufacturer: Moderna   Lot: [id number]   NDC: 50037-048-88

## 2019-09-27 ENCOUNTER — Telehealth: Payer: PPO | Admitting: Cardiology

## 2019-09-27 NOTE — Progress Notes (Unsigned)
{Choose 1 Note Type (Video or Telephone):[phone]}   The patient was identified using 2 identifiers.  Date:  09/27/2019   ID:  Jose Ellis, DOB Jun 05, 1950, MRN [id number]  {Patient [phone]::"Home"} {Provider Location:[phone]::"Home"}  PCP:  Gareth Morgan, MD  Cardiologist:  Dina Rich, MD *** Electrophysiologist:  None   Evaluation Performed:  {Choose Visit Type:[phone]::"Follow-Up Visit"}  Chief Complaint:  ***  History of Present Illness:    Jose Ellis is a 69 y.o. male  seen today for follow up of the following medical problems.   1. Chest pain - reports 2 total episodes of chest pain and palpitations over the last few mwonths.  -most recentlyseen in ER 05/17/16 for symptoms.  - symptoms started around 11pm feeling discomfort left chest, heaviness left sided. Mild pain. Mild SOB, +palpitations. - seen by nurse friend, thought to be having an irregular rhythm -symptomsworst with iced tea.  - in ER noted to have some occasional PVCs - better - episode occurred after cutting grass. Difficult to describe pain, mild in severity. Lasted about 30 minutes. Can occur after drinking caffeine. Better with ativan. Can be stensitive to palpation  CAD risk fctorss; HL, former smoker x 25 years,  quit 20 years, brother MI 26 - since last visit completed heart monitor that was benign.     SH: husband of Jose Ellis, also a patient of mine  The patient {does/does not:200015} have symptoms concerning for COVID-19 infection (fever, chills, cough, or new shortness of breath).    Past Medical History:  Diagnosis Date  . Arthritis   . Heart palpitations   . High cholesterol    Past Surgical History:  Procedure Laterality Date  . NO PAST SURGERIES       No outpatient medications have been marked as taking for the 09/27/19 encounter (Appointment) with Antoine Poche, MD.     Allergies:   Aspirin and Penicillins   Social History    Tobacco Use  . Smoking status: Former Games developer  . Smokeless tobacco: Never Used  Substance Use Topics  . Alcohol use: No    Comment: on weekends  . Drug use: No     Family Hx: The patient's family history includes Heart attack in his brother.  ROS:   Please see the history of present illness.    *** All other systems reviewed and are negative.   Prior CV studies:   The following studies were reviewed today:  07/2016 Event monitor  Telemetry tracings show sinus rhythm  Min HR 43, Max HR 113, Avg HR 63  No symptoms reported  No significant arrhythmias  Labs/Other Tests and Data Reviewed:    EKG:  {EKG/Telemetry Strips [phone]}  Recent Labs: No results found for requested labs within last 8760 hours.   Recent Lipid Panel No results found for: CHOL, TRIG, HDL, CHOLHDL, LDLCALC, LDLDIRECT  Wt Readings from Last 3 Encounters:  09/15/18 180 lb (81.6 kg)  08/14/16 186 lb (84.4 kg)  06/26/16 194 lb (88 kg)     Objective:    Vital Signs:  There were no vitals taken for this visit.   {Heart[facility] Virtual Exam (Optional):[phone]::"VITAL SIGNS:  reviewed"}  ASSESSMENT & PLAN:    1. Chest pain/palpitations - negative event monitor. Very atypical chest pain. Given his CAD risk factors we discussed a possible stress test however he is not in favor at this time. Given how atypical his symptoms are I think this is reasonable decision, and something to just monitor at this time -  start prn lopressor for palpitations.  COVID-19 Education: The signs and symptoms of COVID-19 were discussed with the patient and how to seek care for testing (follow up with PCP or arrange E-visit).  ***The importance of social distancing was discussed today.  Time:   Today, I have spent *** minutes with the patient with telehealth technology discussing the above problems.     Medication Adjustments/Labs and Tests Ordered: Current medicines are reviewed at length with the  patient today.  Concerns regarding medicines are outlined above.   Tests Ordered: No orders of the defined types were placed in this encounter.   Medication Changes: No orders of the defined types were placed in this encounter.   Follow Up:  {F/U Format:[phone]} {follow up:15908}  Signed, Carlyle Dolly, MD  09/27/2019 7:56 AM    [facility]

## 2019-10-12 ENCOUNTER — Other Ambulatory Visit: Payer: Self-pay | Admitting: Student

## 2019-10-24 ENCOUNTER — Telehealth (INDEPENDENT_AMBULATORY_CARE_PROVIDER_SITE_OTHER): Payer: PPO | Admitting: Cardiology

## 2019-10-24 ENCOUNTER — Encounter: Payer: Self-pay | Admitting: Cardiology

## 2019-10-24 VITALS — BP 104/62 | HR 62 | Ht 63.0 in | Wt 178.0 lb

## 2019-10-24 DIAGNOSIS — R0789 Other chest pain: Secondary | ICD-10-CM

## 2019-10-24 DIAGNOSIS — R002 Palpitations: Secondary | ICD-10-CM | POA: Diagnosis not present

## 2019-10-24 NOTE — Patient Instructions (Signed)

## 2019-10-24 NOTE — Progress Notes (Signed)
Virtual Visit via Telephone Note   This visit type was conducted due to national recommendations for restrictions regarding the COVID-19 Pandemic (e.g. social distancing) in an effort to limit this patient's exposure and mitigate transmission in our community.  Due to his co-morbid illnesses, this patient is at least at moderate risk for complications without adequate follow up.  This format is felt to be most appropriate for this patient at this time.  The patient did not have access to video technology/had technical difficulties with video requiring transitioning to audio format only (telephone).  All issues noted in this document were discussed and addressed.  No physical exam could be performed with this format.  Please refer to the patient's chart for his  consent to telehealth for [facility].   The patient was identified using 2 identifiers.  Date:  10/24/2019   ID:  Jose Ellis, DOB 1951/04/04, MRN [id number]  Patient Location: Home Provider Location: Home  PCP:  Gareth Morgan, MD  Cardiologist:  Dina Rich, MD  Electrophysiologist:  None   Evaluation Performed:  Follow-Up Visit  Chief Complaint:  Follow up visit  History of Present Illness:    Jose Ellis is a 69 y.o. male seen today for follow up of the following medical problems.   1. Chest pain/Palpitaoitns - reports 2 total episodes of chest pain and palpitations over the last few mwonths.  -most recentlyseen in ER 05/17/16 for symptoms.  - symptoms started around 11pm feeling discomfort left chest, heaviness left sided. Mild pain. Mild SOB, +palpitations. - seen by nurse friend, thought to be having an irregular rhythm -symptomsworst with iced tea.  - in ER noted to have some occasional PVCs - better - episode occurred after cutting grass. Difficult to describe pain, mild in severity. Lasted about 30 minutes. Can occur after drinking caffeine. Better with ativan. Can be stensitive to palpation   CAD risk fctorss; HL, former smoker x 25 years,  quit 20 years, brother MI 35 -  completed heart monitor that was benign.     - no recent chest pain - intermittent palpitations. Seems to have sporadic episodes. Tend to come on for a few weeks, then be gone for weeks at a time. Takes his lopressor prn.    SH: husband of Crystal Ellwood, also a patient of mine  The patient does not have symptoms concerning for COVID-19 infection (fever, chills, cough, or new shortness of breath).    Past Medical History:  Diagnosis Date  . Arthritis   . Heart palpitations   . High cholesterol    Past Surgical History:  Procedure Laterality Date  . NO PAST SURGERIES       No outpatient medications have been marked as taking for the 10/24/19 encounter (Appointment) with Antoine Poche, MD.     Allergies:   Aspirin and Penicillins   Social History   Tobacco Use  . Smoking status: Former Games developer  . Smokeless tobacco: Never Used  Substance Use Topics  . Alcohol use: No    Comment: on weekends  . Drug use: No     Family Hx: The patient's family history includes Heart attack in his brother.  ROS:   Please see the history of present illness.     All other systems reviewed and are negative.   Prior CV studies:   The following studies were reviewed today:  07/2016 Event monitor  Telemetry tracings show sinus rhythm  Min HR 43, Max HR 113, Avg HR 63  No symptoms reported  No significant arrhythmias  Labs/Other Tests and Data Reviewed:    EKG:  No ECG reviewed.  Recent Labs: No results found for requested labs within last 8760 hours.   Recent Lipid Panel No results found for: CHOL, TRIG, HDL, CHOLHDL, LDLCALC, LDLDIRECT  Wt Readings from Last 3 Encounters:  09/15/18 180 lb (81.6 kg)  08/14/16 186 lb (84.4 kg)  06/26/16 194 lb (88 kg)     Objective:    Vital Signs:   Today's Vitals   10/24/19 0842  BP: 104/62  Pulse: 62  Weight: 178 lb (80.7 kg)  Height: 5'  3" (1.6 m)   Body mass index is 31.53 kg/m. Normal affect. Normal speech pattern and tone. Comfortable, no apparnet distress. No audible signs of sob or wheezing.   ASSESSMENT & PLAN:    1. Chest pain/palpitations - no recent chest pains, he had actually turned down a stress test a few years ago. Without current symptoms continue to monitor.  - Sporadic palpitaitons, prior benign monitor. Did have some PVCs noted a few years back during ER evaluation - continue prn lopressor, if more regular symptoms could schedule his beta blocker.    COVID-19 Education: The signs and symptoms of COVID-19 were discussed with the patient and how to seek care for testing (follow up with PCP or arrange E-visit).  The importance of social distancing was discussed today.  Time:   Today, I have spent 15 minutes with the patient with telehealth technology discussing the above problems.     Medication Adjustments/Labs and Tests Ordered: Current medicines are reviewed at length with the patient today.  Concerns regarding medicines are outlined above.   Tests Ordered: No orders of the defined types were placed in this encounter.   Medication Changes: No orders of the defined types were placed in this encounter.   Follow Up:  As needed  Signed, Carlyle Dolly, MD  10/24/2019 7:48 AM    [facility]

## 2019-11-10 DIAGNOSIS — R002 Palpitations: Secondary | ICD-10-CM | POA: Diagnosis not present

## 2019-11-10 DIAGNOSIS — M79675 Pain in left toe(s): Secondary | ICD-10-CM | POA: Diagnosis not present

## 2019-11-10 DIAGNOSIS — G4700 Insomnia, unspecified: Secondary | ICD-10-CM | POA: Diagnosis not present

## 2019-11-10 DIAGNOSIS — E782 Mixed hyperlipidemia: Secondary | ICD-10-CM | POA: Diagnosis not present

## 2020-02-28 DIAGNOSIS — E782 Mixed hyperlipidemia: Secondary | ICD-10-CM | POA: Diagnosis not present

## 2020-02-28 DIAGNOSIS — G4726 Circadian rhythm sleep disorder, shift work type: Secondary | ICD-10-CM | POA: Diagnosis not present

## 2020-02-28 DIAGNOSIS — I959 Hypotension, unspecified: Secondary | ICD-10-CM | POA: Diagnosis not present

## 2020-02-28 DIAGNOSIS — E6609 Other obesity due to excess calories: Secondary | ICD-10-CM | POA: Diagnosis not present

## 2020-02-28 DIAGNOSIS — M79673 Pain in unspecified foot: Secondary | ICD-10-CM | POA: Diagnosis not present

## 2020-02-28 DIAGNOSIS — R002 Palpitations: Secondary | ICD-10-CM | POA: Diagnosis not present

## 2020-02-28 DIAGNOSIS — Z125 Encounter for screening for malignant neoplasm of prostate: Secondary | ICD-10-CM | POA: Diagnosis not present

## 2020-03-05 DIAGNOSIS — K5900 Constipation, unspecified: Secondary | ICD-10-CM | POA: Diagnosis not present

## 2020-03-05 DIAGNOSIS — K625 Hemorrhage of anus and rectum: Secondary | ICD-10-CM | POA: Diagnosis not present

## 2020-09-03 DIAGNOSIS — E782 Mixed hyperlipidemia: Secondary | ICD-10-CM | POA: Diagnosis not present

## 2020-09-03 DIAGNOSIS — N429 Disorder of prostate, unspecified: Secondary | ICD-10-CM | POA: Diagnosis not present

## 2020-09-03 DIAGNOSIS — E6609 Other obesity due to excess calories: Secondary | ICD-10-CM | POA: Diagnosis not present

## 2020-09-03 DIAGNOSIS — K625 Hemorrhage of anus and rectum: Secondary | ICD-10-CM | POA: Diagnosis not present

## 2020-09-05 ENCOUNTER — Encounter (INDEPENDENT_AMBULATORY_CARE_PROVIDER_SITE_OTHER): Payer: Self-pay | Admitting: *Deleted

## 2020-09-16 DIAGNOSIS — J301 Allergic rhinitis due to pollen: Secondary | ICD-10-CM | POA: Diagnosis not present

## 2020-10-21 ENCOUNTER — Other Ambulatory Visit: Payer: Self-pay | Admitting: Student

## 2020-11-07 ENCOUNTER — Ambulatory Visit: Payer: PPO | Admitting: Urology

## 2020-11-11 ENCOUNTER — Encounter: Payer: Self-pay | Admitting: Urology

## 2020-11-11 ENCOUNTER — Ambulatory Visit: Payer: PPO | Admitting: Urology

## 2020-11-11 ENCOUNTER — Other Ambulatory Visit: Payer: Self-pay

## 2020-11-11 VITALS — BP 115/76 | HR 62 | Ht 63.0 in | Wt 166.0 lb

## 2020-11-11 DIAGNOSIS — N400 Enlarged prostate without lower urinary tract symptoms: Secondary | ICD-10-CM | POA: Diagnosis not present

## 2020-11-11 DIAGNOSIS — N4289 Other specified disorders of prostate: Secondary | ICD-10-CM

## 2020-11-11 NOTE — Progress Notes (Signed)
   11/11/2020 10:44 AM   Casimiro Modena Jansky 28-Oct-1950 [id number]  Referring provider: Gareth Morgan, MD [address] [place],  [place] [address]  Chief Complaint  Patient presents with   New Patient (Initial Visit)    HPI: Woodfin Kiss is a 70 y.o. male referred for an asymmetric prostate.  No bothersome LUTS; on tamsulosin Notes occasional dysuria when dehydrated No PSA results on referral notes and patient states he has not had blood drawn in over a year   PMH: Past Medical History:  Diagnosis Date   Arthritis    Heart palpitations    High cholesterol     Surgical History: Past Surgical History:  Procedure Laterality Date   NO PAST SURGERIES      Home Medications:  Allergies as of 11/11/2020       Reactions   Aspirin    Pt states it burns his stomach   Penicillins    Was told as a child he was allegic        Medication List        Accurate as of November 11, 2020 10:44 AM. If you have any questions, ask your nurse or doctor.          acetaminophen 325 MG tablet Commonly known as: TYLENOL Take 650 mg by mouth every 6 (six) hours as needed for mild pain.   LORazepam 1 MG tablet Commonly known as: ATIVAN Take 1 mg by mouth every 8 (eight) hours as needed for anxiety or sleep.   lovastatin 40 MG tablet Commonly known as: MEVACOR Take 40 mg by mouth daily.   metoprolol tartrate 25 MG tablet Commonly known as: LOPRESSOR TAKE ONE HALF TABLETS BY MOUTH 2 TIMES DAILY AS NEEDED FOR PALPITATIONS.   naproxen sodium 220 MG tablet Commonly known as: ALEVE Take 440 mg by mouth 2 (two) times daily with a meal.        Allergies:  Allergies  Allergen Reactions   Aspirin     Pt states it burns his stomach   Penicillins     Was told as a child he was allegic    Family History: Family History  Problem Relation Age of Onset   Heart attack Brother     Social History:  reports that he has quit smoking. He has never used smokeless tobacco. He  reports that he does not drink alcohol and does not use drugs.   Physical Exam: BP 115/76   Pulse 62   Ht 5\' 3"  (1.6 m)   Wt 166 lb (75.3 kg)   BMI 29.41 kg/m   Constitutional:  Alert and oriented, No acute distress. HEENT: [place] AT, moist mucus membranes.  Trachea midline, no masses. Cardiovascular: No clubbing, cyanosis, or edema. Respiratory: Normal respiratory effort, no increased work of breathing. GI: Abdomen is soft, nontender, nondistended, no abdominal masses GU: Prostate 40 g without nodules, induration.  Asymmetric L >R however consistency is normal and uniform throughout    Assessment & Plan:    1.  BPH with LUTS Asymmetric prostate however normal consistency No further evaluation required based on DRE Patient denies prior PSA and we discussed recommendations for prostate cancer screening with PSA/DRE from ages 51-69 He desired to pursue PSA testing and will be notified with results   51-72, MD  [facility] [address] [place], [place] [phone]

## 2020-11-12 ENCOUNTER — Telehealth: Payer: Self-pay | Admitting: *Deleted

## 2020-11-12 LAB — PSA: Prostate Specific Ag, Serum: 1.8 ng/mL (ref 0.0–4.0)

## 2020-11-12 NOTE — Telephone Encounter (Signed)
-----   Message from Riki Altes, MD sent at 11/12/2020  2:52 PM EDT ----- PSA was normal at 1.8

## 2020-11-13 NOTE — Telephone Encounter (Signed)
Notified patient as instructed, patient pleased. Discussed follow-up appointments, patient agrees  

## 2021-03-05 DIAGNOSIS — E6609 Other obesity due to excess calories: Secondary | ICD-10-CM | POA: Diagnosis not present

## 2021-03-05 DIAGNOSIS — Z23 Encounter for immunization: Secondary | ICD-10-CM | POA: Diagnosis not present

## 2021-03-05 DIAGNOSIS — G4726 Circadian rhythm sleep disorder, shift work type: Secondary | ICD-10-CM | POA: Diagnosis not present

## 2021-03-05 DIAGNOSIS — Z7189 Other specified counseling: Secondary | ICD-10-CM | POA: Diagnosis not present

## 2021-04-21 DIAGNOSIS — I959 Hypotension, unspecified: Secondary | ICD-10-CM | POA: Diagnosis not present

## 2021-04-21 DIAGNOSIS — E782 Mixed hyperlipidemia: Secondary | ICD-10-CM | POA: Diagnosis not present

## 2021-04-21 DIAGNOSIS — R002 Palpitations: Secondary | ICD-10-CM | POA: Diagnosis not present

## 2021-04-21 DIAGNOSIS — Z125 Encounter for screening for malignant neoplasm of prostate: Secondary | ICD-10-CM | POA: Diagnosis not present

## 2022-04-07 ENCOUNTER — Other Ambulatory Visit (HOSPITAL_COMMUNITY): Payer: Self-pay | Admitting: Nurse Practitioner

## 2022-04-07 DIAGNOSIS — Z87891 Personal history of nicotine dependence: Secondary | ICD-10-CM

## 2022-04-07 DIAGNOSIS — I2510 Atherosclerotic heart disease of native coronary artery without angina pectoris: Secondary | ICD-10-CM

## 2022-04-16 ENCOUNTER — Ambulatory Visit (HOSPITAL_COMMUNITY)
Admission: RE | Admit: 2022-04-16 | Discharge: 2022-04-16 | Disposition: A | Discharge status: Home / Self Care | Payer: PPO | Source: Ambulatory Visit | Attending: Nurse Practitioner | Admitting: Nurse Practitioner

## 2022-04-16 DIAGNOSIS — Z87891 Personal history of nicotine dependence: Secondary | ICD-10-CM | POA: Diagnosis present

## 2022-04-16 DIAGNOSIS — I2510 Atherosclerotic heart disease of native coronary artery without angina pectoris: Secondary | ICD-10-CM

## 2022-04-16 DIAGNOSIS — Z136 Encounter for screening for cardiovascular disorders: Secondary | ICD-10-CM | POA: Insufficient documentation

## 2022-09-11 ENCOUNTER — Ambulatory Visit: Payer: PPO | Attending: Cardiology | Admitting: Cardiology

## 2022-09-11 ENCOUNTER — Ambulatory Visit (INDEPENDENT_AMBULATORY_CARE_PROVIDER_SITE_OTHER): Payer: PPO

## 2022-09-11 ENCOUNTER — Encounter: Payer: Self-pay | Admitting: Cardiology

## 2022-09-11 VITALS — BP 118/76 | HR 55 | Ht 63.0 in | Wt 175.0 lb

## 2022-09-11 DIAGNOSIS — I499 Cardiac arrhythmia, unspecified: Secondary | ICD-10-CM | POA: Diagnosis not present

## 2022-09-11 DIAGNOSIS — R002 Palpitations: Secondary | ICD-10-CM

## 2022-09-11 NOTE — Progress Notes (Signed)
Clinical Summary Mr. Haney is a 72 y.o.male seen today for follow up of the following medical problems.    1. Chest pain/Palpitaoitns - at prior visit reported  2 total episodes of chest pain and palpitations over the last few mwonths.  -most recently seen in ER 05/17/16 for symptoms.  - symptoms started around 11pm feeling discomfort left chest, heaviness left sided. Mild pain. Mild SOB, +palpitations. - seen by nurse friend, thought to be having an irregular rhythm -symptoms worst with iced tea.  - in ER noted to have some occasional PVCs  - better  - episode occurred after cutting grass. Difficult to describe pain, mild in severity. Lasted about 30 minutes. Can occur after drinking caffeine. Better with ativan. Can be stensitive to palpation   CAD risk fctorss; HL, former smoker x 25 years,  quit 20 years, brother MI 23 -  completed heart monitor that was benign in 2018     - recently increased palpitations - feeling of heart skipping a beat, improves with prn metoprolol.  - feeling can last for a few hours at times. - increased episode of the last month, occurring about every day.  - no coffee, occasonal pepsi, no tea, no energy drinks, no EtOH       SH: husband of Hyatt Eduardo, also a patient of mine Past Medical History:  Diagnosis Date   Arthritis    Heart palpitations    High cholesterol      Allergies  Allergen Reactions   Aspirin     Pt states it burns his stomach   Penicillins     Was told as a child he was allegic     Current Outpatient Medications  Medication Sig Dispense Refill   acetaminophen (TYLENOL) 325 MG tablet Take 650 mg by mouth every 6 (six) hours as needed for mild pain.     LORazepam (ATIVAN) 1 MG tablet Take 1 mg by mouth every 8 (eight) hours as needed for anxiety or sleep.     lovastatin (MEVACOR) 40 MG tablet Take 40 mg by mouth daily.      metoprolol tartrate (LOPRESSOR) 25 MG tablet TAKE ONE HALF TABLETS BY MOUTH 2 TIMES DAILY  AS NEEDED FOR PALPITATIONS. 90 tablet 3   naproxen sodium (ANAPROX) 220 MG tablet Take 440 mg by mouth 2 (two) times daily with a meal.     No current facility-administered medications for this visit.     Past Surgical History:  Procedure Laterality Date   NO PAST SURGERIES       Allergies  Allergen Reactions   Aspirin     Pt states it burns his stomach   Penicillins     Was told as a child he was allegic      Family History  Problem Relation Age of Onset   Heart attack Brother      Social History Mr. Mezzanotte reports that he has quit smoking. He has never used smokeless tobacco. Mr. Yackel reports no history of alcohol use.   Review of Systems CONSTITUTIONAL: No weight loss, fever, chills, weakness or fatigue.  HEENT: Eyes: No visual loss, blurred vision, double vision or yellow sclerae.No hearing loss, sneezing, congestion, runny nose or sore throat.  SKIN: No rash or itching.  CARDIOVASCULAR: per hpi RESPIRATORY: No shortness of breath, cough or sputum.  GASTROINTESTINAL: No anorexia, nausea, vomiting or diarrhea. No abdominal pain or blood.  GENITOURINARY: No burning on urination, no polyuria NEUROLOGICAL: No headache, dizziness,  syncope, paralysis, ataxia, numbness or tingling in the extremities. No change in bowel or bladder control.  MUSCULOSKELETAL: No muscle, back pain, joint pain or stiffness.  LYMPHATICS: No enlarged nodes. No history of splenectomy.  PSYCHIATRIC: No history of depression or anxiety.  ENDOCRINOLOGIC: No reports of sweating, cold or heat intolerance. No polyuria or polydipsia.  [name]   Physical Examination Today's Vitals   09/11/22 1017  BP: 118/76  Pulse: (!) 55  SpO2: 95%  Weight: 175 lb (79.4 kg)  Height: 5\' 3"  (1.6 m)   Body mass index is 31 kg/m.  Gen: resting comfortably, no acute distress HEENT: no scleral icterus, pupils equal round and reactive, no palptable cervical adenopathy,  CV: RRR, no m/rg, no jvd Resp: Clear to  auscultation bilaterally GI: abdomen is soft, non-tender, non-distended, normal bowel sounds, no hepatosplenomegaly MSK: extremities are warm, no edema.  Skin: warm, no rash Neuro:  no focal deficits Psych: appropriate affect    Assessment and Plan   1. Palpitations - progressing symptoms, plan for 7 day zio patch - continue prn lopressor, limited dosing due to resting bradycardia - EKG today sinus brady 55    Antoine Poche, M.D.

## 2022-09-11 NOTE — Patient Instructions (Signed)
Medication Instructions:  Your physician recommends that you continue on your current medications as directed. Please refer to the Current Medication list given to you today.  *If you need a refill on your cardiac medications before your next appointment, please call your pharmacy*   Lab Work: None If you have labs (blood work) drawn today and your tests are completely normal, you will receive your results only by: MyChart Message (if you have MyChart) OR A paper copy in the mail If you have any lab test that is abnormal or we need to change your treatment, we will call you to review the results.   Testing/Procedures: 7 day zio monitor   Follow-Up: At [facility], you and your health needs are our priority.  As part of our continuing mission to provide you with exceptional heart care, we have created designated Provider Care Teams.  These Care Teams include your primary Cardiologist (physician) and Advanced Practice Providers (APPs -  Physician Assistants and Nurse Practitioners) who all work together to provide you with the care you need, when you need it.  We recommend signing up for the patient portal called "MyChart".  Sign up information is provided on this After Visit Summary.  MyChart is used to connect with patients for Virtual Visits (Telemedicine).  Patients are able to view lab/test results, encounter notes, upcoming appointments, etc.  Non-urgent messages can be sent to your provider as well.   To learn more about what you can do with MyChart, go to [email or web address].    Your next appointment:   6 week(s)  Provider:   You will see one of the following Advanced Practice Providers on your designated Care Team:   Randall An, PA-C  Jacolyn Reedy, PA-C       Other Instructions Jose Ellis- Long Term Monitor Instructions   Your physician has requested you wear your ZIO patch monitor___7____days.   This is a single patch monitor.  [employer] supplies one  patch monitor per enrollment.  Additional stickers are not available.   Please do not apply patch if you will be having a Nuclear Stress Test, Echocardiogram, Cardiac CT, MRI, or Chest Xray during the time frame you would be wearing the monitor. The patch cannot be worn during these tests.  You cannot remove and re-apply the ZIO XT patch monitor.   Your ZIO patch monitor will be sent USPS Priority mail from [facility] directly to your home address. The monitor may also be mailed to a PO BOX if home delivery is not available.   It may take 3-5 days to receive your monitor after you have been enrolled.   Once you have received you monitor, please review enclosed instructions.  Your monitor has already been registered assigning a specific monitor serial # to you.   Applying the monitor   Shave hair from upper left chest.   Hold abrader disc by orange tab.  Rub abrader in 40 strokes over left upper chest as indicated in your monitor instructions.   Clean area with 4 enclosed alcohol pads .  Use all pads to assure are is cleaned thoroughly.  Let dry.   Apply patch as indicated in monitor instructions.  Patch will be place under collarbone on left side of chest with arrow pointing upward.   Rub patch adhesive wings for 2 minutes.Remove white label marked "1".  Remove white label marked "2".  Rub patch adhesive wings for 2 additional minutes.   While looking in a mirror, press and  release button in center of patch.  A small green light will flash 3-4 times .  This will be your only indicator the monitor has been turned on.     Do not shower for the first 24 hours.  You may shower after the first 24 hours.   Press button if you feel a symptom. You will hear a small click.  Record Date, Time and Symptom in the Patient Log Book.   When you are ready to remove patch, follow instructions on last 2 pages of Patient Log Book.  Stick patch monitor onto last page of Patient Log Book.   Place  Patient Log Book in [place] box.  Use locking tab on box and tape box closed securely.  The [employer] and [employer] has [employer] on it.  Please place in mailbox as soon as possible.  Your physician should have your test results approximately 7 days after the monitor has been mailed back to [facility].   Call [facility] Customer Care at [phone] if you have questions regarding your ZIO XT patch monitor.  Call them immediately if you see an orange light blinking on your monitor.   If your monitor falls off in less than 4 days contact our Monitor department at [phone].  If your monitor becomes loose or falls off after 4 days call [employer] at [phone] for suggestions on securing your monitor.

## 2022-10-15 NOTE — Progress Notes (Signed)
  Cardiology Office Note:  .   Date:  10/26/2022  ID:  Anola Gurney, DOB [birth year], MRN [id number] PCP: Gareth Morgan, MD  [facility] Providers Cardiologist:  Dina Rich, MD    History of Present Illness: Jose Ellis   Jose Ellis is a 72 y.o. male with history of palpitations, PVC's, chest pain, family history CAD brother MI 61, former smoker x 25 yrs.  Patient saw Dr. Wyline Mood 09/11/22 with increase in palpitations. Baseline sinus brady 55 so meds not increased. Zio rare PVC's, PAC's and symptoms correlated with NSR.  Patient comes in for f/u. Monitor reviewed with patient. He's having less palpitations and didn't have many while wearing the monitor. Only taking metoprolol prn.stopped drinking Pepsi.  ROS:   . Studies Reviewed: .        7 day monitor 09/2022   Rare supraventricular ectopy in the form of isolated PACs   Rare ventricular ectopy in the form of isolated PVCs   Reported symptoms correlated with normal sinus rhythm Risk Assessment/Calculations:             Physical Exam:   VS:  BP 132/64   Pulse 67   Ht 5\' 4"  (1.626 m)   Wt 173 lb 6.4 oz (78.7 kg)   SpO2 97%   BMI 29.76 kg/m    Wt Readings from Last 3 Encounters:  10/26/22 173 lb 6.4 oz (78.7 kg)  09/11/22 175 lb (79.4 kg)  11/11/20 166 lb (75.3 kg)    GEN: Well nourished, well developed in no acute distress NECK: No JVD; No carotid bruits CARDIAC:  RRR, no murmurs, rubs, gallops RESPIRATORY:  Clear to auscultation without rales, wheezing or rhonchi  ABDOMEN: Soft, non-tender, non-distended EXTREMITIES:  No edema; No deformity   ASSESSMENT AND PLAN: .   Palpitations with rare PVC's and PAC's on Zio-see above-not formally read. No recent symptoms since decreasing Pepsi and taking metoprolol prn  History of chest pain with palpitations-I offered Coronary calcium score and he wants to think about it. No further chest pain  Family history of CAD  Former smoker.       Dispo: f/ uwith Dr.  Wyline Mood in 1 yr.  Signed, Jacolyn Reedy, PA-C

## 2022-10-26 ENCOUNTER — Ambulatory Visit: Payer: PPO | Attending: Physician Assistant | Admitting: Physician Assistant

## 2022-10-26 ENCOUNTER — Encounter: Payer: Self-pay | Admitting: Physician Assistant

## 2022-10-26 VITALS — BP 132/64 | HR 67 | Ht 64.0 in | Wt 173.4 lb

## 2022-10-26 DIAGNOSIS — I493 Ventricular premature depolarization: Secondary | ICD-10-CM | POA: Diagnosis not present

## 2022-10-26 DIAGNOSIS — R002 Palpitations: Secondary | ICD-10-CM

## 2022-10-26 DIAGNOSIS — Z87898 Personal history of other specified conditions: Secondary | ICD-10-CM | POA: Diagnosis not present

## 2022-10-26 DIAGNOSIS — Z87891 Personal history of nicotine dependence: Secondary | ICD-10-CM

## 2022-10-26 DIAGNOSIS — Z8249 Family history of ischemic heart disease and other diseases of the circulatory system: Secondary | ICD-10-CM

## 2022-10-26 MED ORDER — METOPROLOL TARTRATE 25 MG PO TABS: 25.0000 mg | ORAL_TABLET | Freq: Every day | ORAL | 3 refills | Status: DC

## 2022-10-26 NOTE — Patient Instructions (Signed)
Medication Instructions:  Your physician recommends that you continue on your current medications as directed. Please refer to the Current Medication list given to you today.  *If you need a refill on your cardiac medications before your next appointment, please call your pharmacy*   Lab Work: None If you have labs (blood work) drawn today and your tests are completely normal, you will receive your results only by: MyChart Message (if you have MyChart) OR A paper copy in the mail If you have any lab test that is abnormal or we need to change your treatment, we will call you to review the results.   Testing/Procedures: None   Follow-Up: At [facility], you and your health needs are our priority.  As part of our continuing mission to provide you with exceptional heart care, we have created designated Provider Care Teams.  These Care Teams include your primary Cardiologist (physician) and Advanced Practice Providers (APPs -  Physician Assistants and Nurse Practitioners) who all work together to provide you with the care you need, when you need it.  We recommend signing up for the patient portal called "MyChart".  Sign up information is provided on this After Visit Summary.  MyChart is used to connect with patients for Virtual Visits (Telemedicine).  Patients are able to view lab/test results, encounter notes, upcoming appointments, etc.  Non-urgent messages can be sent to your provider as well.   To learn more about what you can do with MyChart, go to [email or web address].    Your next appointment:   1 year(s)  Provider:   Jonathan Branch, MD    Other Instructions    

## 2022-10-27 ENCOUNTER — Ambulatory Visit (INDEPENDENT_AMBULATORY_CARE_PROVIDER_SITE_OTHER): Payer: PPO | Admitting: Family Medicine

## 2022-10-27 VITALS — BP 104/65 | HR 64 | Temp 98.5°F | Ht 64.0 in | Wt 172.0 lb

## 2022-10-27 DIAGNOSIS — Z114 Encounter for screening for human immunodeficiency virus [HIV]: Secondary | ICD-10-CM

## 2022-10-27 DIAGNOSIS — E538 Deficiency of other specified B group vitamins: Secondary | ICD-10-CM | POA: Diagnosis not present

## 2022-10-27 DIAGNOSIS — M5440 Lumbago with sciatica, unspecified side: Secondary | ICD-10-CM | POA: Diagnosis not present

## 2022-10-27 DIAGNOSIS — Z131 Encounter for screening for diabetes mellitus: Secondary | ICD-10-CM

## 2022-10-27 DIAGNOSIS — Z1211 Encounter for screening for malignant neoplasm of colon: Secondary | ICD-10-CM

## 2022-10-27 DIAGNOSIS — Z13220 Encounter for screening for lipoid disorders: Secondary | ICD-10-CM

## 2022-10-27 DIAGNOSIS — Z1329 Encounter for screening for other suspected endocrine disorder: Secondary | ICD-10-CM

## 2022-10-27 DIAGNOSIS — I10 Essential (primary) hypertension: Secondary | ICD-10-CM | POA: Diagnosis not present

## 2022-10-27 DIAGNOSIS — M549 Dorsalgia, unspecified: Secondary | ICD-10-CM | POA: Insufficient documentation

## 2022-10-27 DIAGNOSIS — E559 Vitamin D deficiency, unspecified: Secondary | ICD-10-CM | POA: Diagnosis not present

## 2022-10-27 DIAGNOSIS — Z1159 Encounter for screening for other viral diseases: Secondary | ICD-10-CM

## 2022-10-27 MED ORDER — TIZANIDINE HCL 4 MG PO CAPS: 4.0000 mg | ORAL_CAPSULE | Freq: Two times a day (BID) | ORAL | 1 refills | Status: DC | PRN

## 2022-10-27 NOTE — Patient Instructions (Signed)

## 2022-10-27 NOTE — Progress Notes (Signed)
New Patient Office Visit   Subjective   Patient ID: Jose Ellis, male    DOB: Jun 28, 1950  Age: 72 y.o. MRN: [id number]  CC: No chief complaint on file.   HPI Jose Ellis 72 year old male, presents to establish care. He  has a past medical history of Arthritis, Heart palpitations, and High cholesterol.  Back Pain This is a recurrent problem. The problem occurs intermittently. The problem has been waxing and waning since onset. The pain is present in the lumbar spine. The quality of the pain is described as aching. The pain radiates to the right thigh. The pain is at a severity of 9/10. The pain is The same all the time. The symptoms are aggravated by lying down and position. Stiffness is present At night. Associated symptoms include numbness. Pertinent negatives include no abdominal pain, bladder incontinence, bowel incontinence, chest pain, dysuria, fever or headaches. Risk factors include poor posture. He has tried analgesics for the symptoms. The treatment provided mild relief.      Outpatient Encounter Medications as of 10/27/2022  Medication Sig   atorvastatin (LIPITOR) 20 MG tablet Take 20 mg by mouth daily.   LORazepam (ATIVAN) 1 MG tablet Take 1 mg by mouth every 8 (eight) hours as needed for anxiety or sleep.   metoprolol tartrate (LOPRESSOR) 25 MG tablet Take 1 tablet (25 mg total) by mouth daily.   naproxen sodium (ANAPROX) 220 MG tablet Take 440 mg by mouth 2 (two) times daily with a meal.   tiZANidine (ZANAFLEX) 4 MG capsule Take 1 capsule (4 mg total) by mouth 2 (two) times daily as needed for muscle spasms.   acetaminophen (TYLENOL) 325 MG tablet Take 650 mg by mouth every 6 (six) hours as needed for mild pain. (Patient not taking: Reported on 10/27/2022)   No facility-administered encounter medications on file as of 10/27/2022.    Past Surgical History:  Procedure Laterality Date   NO PAST SURGERIES      Review of Systems  Constitutional:  Negative for  chills and fever.  HENT:  Negative for ear pain.   Eyes:  Negative for double vision.  Respiratory:  Negative for shortness of breath.   Cardiovascular:  Negative for chest pain.  Gastrointestinal:  Negative for abdominal pain and bowel incontinence.  Genitourinary:  Negative for bladder incontinence and dysuria.  Musculoskeletal:  Positive for back pain.  Skin:  Negative for rash.  Neurological:  Positive for numbness. Negative for dizziness and headaches.      Objective    BP 104/65   Pulse 64   Temp 98.5 F (36.9 C) (Oral)   Ht 5\' 4"  (1.626 m)   Wt 172 lb (78 kg)   SpO2 96%   BMI 29.52 kg/m   Physical Exam Vitals reviewed.  Constitutional:      General: He is not in acute distress.    Appearance: Normal appearance. He is not ill-appearing, toxic-appearing or diaphoretic.  HENT:     Head: Normocephalic.  Eyes:     General:        Right eye: No discharge.        Left eye: No discharge.     Conjunctiva/sclera: Conjunctivae normal.  Cardiovascular:     Rate and Rhythm: Normal rate.     Pulses: Normal pulses.     Heart sounds: Normal heart sounds.  Pulmonary:     Effort: Pulmonary effort is normal. No respiratory distress.     Breath sounds: Normal breath sounds.  Abdominal:     General: Bowel sounds are normal.     Palpations: Abdomen is soft.     Tenderness: There is no abdominal tenderness. There is no right CVA tenderness, left CVA tenderness or guarding.  Musculoskeletal:        General: Normal range of motion.     Cervical back: Normal range of motion.     Lumbar back: Spasms present. Normal range of motion. Negative right straight leg raise test and negative left straight leg raise test.  Skin:    General: Skin is warm and dry.     Capillary Refill: Capillary refill takes less than 2 seconds.  Neurological:     General: No focal deficit present.     Mental Status: He is alert and oriented to person, place, and time.     Coordination: Coordination normal.      Gait: Gait normal.  Psychiatric:        Mood and Affect: Mood normal.        Behavior: Behavior normal.       Assessment & Plan:  Screening for colon cancer -     Cologuard  Primary hypertension -     Microalbumin / creatinine urine ratio -     CMP14+EGFR -     CBC with Differential/Platelet  Need for hepatitis C screening test -     Hepatitis C antibody  Vitamin D deficiency -     VITAMIN D 25 Hydroxy (Vit-D Deficiency, Fractures)  Screening for HIV (human immunodeficiency virus) -     HIV Antibody (routine testing w rflx)  Screening for thyroid disorder -     TSH + free T4  Screening for lipid disorders -     Lipid panel  Screening for diabetes mellitus -     Hemoglobin A1c  Vitamin B12 deficiency -     Vitamin B12  Bilateral low back pain with sciatica, sciatica laterality unspecified, unspecified chronicity Assessment & Plan: Zanaflex 4 mg PRN Discussed medication desired effects, potential side effects. Non pharmacological interventions include rest, avoid twisting, Improper bending, straining lower back. Demonstration of proper body mechanics. Alternate ice and heat. Recommend stretching back and legs. Follow up for worsening or persistent symptoms. Patient verbalizes understanding regarding plan of care and all questions answered.    Other orders -     tiZANidine HCl; Take 1 capsule (4 mg total) by mouth 2 (two) times daily as needed for muscle spasms.  Dispense: 30 capsule; Refill: 1    Return in about 4 months (around 02/26/2023), or if symptoms worsen or fail to improve, for chronic follow-up, routine labs.   Jose Lederer Newman Nip, FNP

## 2022-10-27 NOTE — Assessment & Plan Note (Signed)
Zanaflex 4 mg PRN Discussed medication desired effects, potential side effects. Non pharmacological interventions include rest, avoid twisting, Improper bending, straining lower back. Demonstration of proper body mechanics. Alternate ice and heat. Recommend stretching back and legs. Follow up for worsening or persistent symptoms. Patient verbalizes understanding regarding plan of care and all questions answered.

## 2022-10-30 LAB — TSH+FREE T4
Free T4: 1.17 ng/dL (ref 0.82–1.77)
TSH: 3.5 u[IU]/mL (ref 0.450–4.500)

## 2022-10-30 LAB — CBC WITH DIFFERENTIAL/PLATELET
Basophils Absolute: 0 10*3/uL (ref 0.0–0.2)
Basos: 1 %
EOS (ABSOLUTE): 0.1 10*3/uL (ref 0.0–0.4)
Eos: 3 %
Hematocrit: 45.3 % (ref 37.5–51.0)
Hemoglobin: 15 g/dL (ref 13.0–17.7)
Immature Grans (Abs): 0 10*3/uL (ref 0.0–0.1)
Immature Granulocytes: 0 %
Lymphocytes Absolute: 1.9 10*3/uL (ref 0.7–3.1)
Lymphs: 42 %
MCH: 31.4 pg (ref 26.6–33.0)
MCHC: 33.1 g/dL (ref 31.5–35.7)
MCV: 95 fL (ref 79–97)
Monocytes Absolute: 0.4 10*3/uL (ref 0.1–0.9)
Monocytes: 9 %
Neutrophils Absolute: 2.1 10*3/uL (ref 1.4–7.0)
Neutrophils: 45 %
Platelets: 256 10*3/uL (ref 150–450)
RBC: 4.78 x10E6/uL (ref 4.14–5.80)
RDW: 13.5 % (ref 11.6–15.4)
WBC: 4.5 10*3/uL (ref 3.4–10.8)

## 2022-10-30 LAB — CMP14+EGFR
ALT: 18 IU/L (ref 0–44)
AST: 18 IU/L (ref 0–40)
Albumin: 4.3 g/dL (ref 3.9–4.9)
Alkaline Phosphatase: 101 IU/L (ref 44–121)
BUN/Creatinine Ratio: 26 — ABNORMAL HIGH (ref 10–24)
BUN: 21 mg/dL (ref 8–27)
Bilirubin Total: 0.5 mg/dL (ref 0.0–1.2)
CO2: 20 mmol/L (ref 20–29)
Calcium: 9.4 mg/dL (ref 8.6–10.2)
Chloride: 105 mmol/L (ref 96–106)
Creatinine, Ser: 0.8 mg/dL (ref 0.76–1.27)
Globulin, Total: 2.4 g/dL (ref 1.5–4.5)
Glucose: 91 mg/dL (ref 70–99)
Potassium: 4.3 mmol/L (ref 3.5–5.2)
Sodium: 139 mmol/L (ref 134–144)
Total Protein: 6.7 g/dL (ref 6.0–8.5)
eGFR: 96 mL/min/{1.73_m2} (ref >59)

## 2022-10-30 LAB — HIV ANTIBODY (ROUTINE TESTING W REFLEX): HIV Screen 4th Generation wRfx: NONREACTIVE

## 2022-10-30 LAB — MICROALBUMIN / CREATININE URINE RATIO
Creatinine, Urine: 112.7 mg/dL
Microalb/Creat Ratio: 8 mg/g creat (ref 0–29)
Microalbumin, Urine: 9.1 ug/mL

## 2022-10-30 LAB — VITAMIN D 25 HYDROXY (VIT D DEFICIENCY, FRACTURES): Vit D, 25-Hydroxy: 27.7 ng/mL — ABNORMAL LOW (ref 30.0–100.0)

## 2022-10-30 LAB — HEMOGLOBIN A1C
Est. average glucose Bld gHb Est-mCnc: 123 mg/dL
Hgb A1c MFr Bld: 5.9 % — ABNORMAL HIGH (ref 4.8–5.6)

## 2022-10-30 LAB — HEPATITIS C ANTIBODY: Hep C Virus Ab: NONREACTIVE

## 2022-10-30 LAB — LIPID PANEL
Chol/HDL Ratio: 4.3 ratio (ref 0.0–5.0)
Cholesterol, Total: 179 mg/dL (ref 100–199)
HDL: 42 mg/dL (ref >39)
LDL Chol Calc (NIH): 119 mg/dL — ABNORMAL HIGH (ref 0–99)
Triglycerides: 98 mg/dL (ref 0–149)
VLDL Cholesterol Cal: 18 mg/dL (ref 5–40)

## 2022-11-02 ENCOUNTER — Other Ambulatory Visit: Payer: Self-pay | Admitting: Family Medicine

## 2022-11-02 MED ORDER — ATORVASTATIN CALCIUM 20 MG PO TABS: 20.0000 mg | ORAL_TABLET | Freq: Every day | ORAL | 1 refills | Status: DC

## 2022-11-02 NOTE — Progress Notes (Signed)
Please inform patient,  Cholesterol levels elevated, advise lifestyle modifications follow diet low in saturated fat, reduce dietary salt intake, avoid fatty foods, maintain an exercise routine 3 to 5 days a week for a minimum total of 150 minutes.  Refilled Lipitor 20 mg once a day- medication sent to pharmacy.  Hemoglobin A1c 5.9 indicates prediabetes - no medication intervention just lifestyle changes  It is important to follow a DASH diet which includes vegetables,fruits,whole grains, fat free or low fat diary,fish,poultry,beans,nuts and seeds,vegetable oils.  Repeat labs in our next visit   Vitamin D levels low, I advise to taking  over the counter supplements of vitamin D 800 IU/day to prevent low vitamin D levels. Consuming Vitamin D rich food sources include fish, salmon, sardines, egg yolks, red meat, liver, oranges, soy milk.

## 2023-02-25 NOTE — Progress Notes (Signed)
Established Patient Office Visit   Subjective  Patient ID: Jose Ellis, male    DOB: [birth year]  Age: 72 y.o. MRN: [id number]  Chief Complaint  Patient presents with   Follow-up    Back pain/ Labs f/u wants to discuss if it is safe to take Lorazepam with Zanaflex.    He  has a past medical history of Arthritis, Heart palpitations, High cholesterol, and Hypertension.  Dysuria The patient presents with a new onset of dysuria that has been intermittent, with an aching and burning quality. He rates the pain as moderate, at 5/10 in severity. There is no associated fever, history of pyelonephritis, kidney stones, or recurrent UTIs. Pertinent negatives include no chills, flank pain, urinary frequency, hematuria, or urgency. He has not attempted any treatments for these symptoms    Review of Systems  Constitutional:  Negative for fever.  Eyes:  Negative for blurred vision.  Respiratory:  Negative for shortness of breath.   Cardiovascular:  Negative for chest pain.  Gastrointestinal:  Negative for abdominal pain.  Genitourinary:  Positive for dysuria. Negative for flank pain, frequency, hematuria and urgency.  Musculoskeletal:  Positive for myalgias.  Neurological:  Negative for dizziness and headaches.      Objective:     BP 105/65   Pulse (!) 53   Ht 5\' 4"  (1.626 m)   Wt 168 lb 1.3 oz (76.2 kg)   SpO2 95%   BMI 28.85 kg/m  BP Readings from Last 3 Encounters:  02/26/23 105/65  10/27/22 104/65  10/26/22 132/64      Physical Exam Vitals reviewed.  Constitutional:      General: He is not in acute distress.    Appearance: Normal appearance. He is not ill-appearing, toxic-appearing or diaphoretic.  HENT:     Head: Normocephalic.     Right Ear: Tympanic membrane normal.     Left Ear: Tympanic membrane normal.     Nose: Nose normal.     Mouth/Throat:     Mouth: Mucous membranes are moist.  Eyes:     General:        Right eye: No discharge.        Left eye: No  discharge.     Conjunctiva/sclera: Conjunctivae normal.  Cardiovascular:     Pulses: Normal pulses.     Heart sounds: Normal heart sounds.  Pulmonary:     Effort: Pulmonary effort is normal. No respiratory distress.     Breath sounds: Normal breath sounds.  Musculoskeletal:        General: Normal range of motion.     Cervical back: Normal range of motion.  Skin:    General: Skin is warm and dry.     Capillary Refill: Capillary refill takes less than 2 seconds.  Neurological:     Mental Status: He is alert.     Coordination: Coordination normal.     Gait: Gait normal.  Psychiatric:        Mood and Affect: Mood normal.        Behavior: Behavior normal.      No results found for any visits on 02/26/23.  The 10-year ASCVD risk score (Arnett DK, et al., 2019) is: 14.4%    Assessment & Plan:  Primary hypertension Assessment & Plan: Vitals:   02/26/23 1406  BP: 105/65   Blood pressure controlled in today's visit Continue Metoprolol 25 mg once daily Labs ordered. Discussed with  patient to monitor their blood pressure regularly and maintain a heart-healthy diet  rich in fruits, vegetables, whole grains, and low-fat dairy, while reducing sodium intake to less than 2,300 mg per day. Regular physical activity, such as 30 minutes of moderate exercise most days of the week, will help lower blood pressure and improve overall cardiovascular health. Avoiding smoking, limiting alcohol consumption, and managing stress. Take  prescribed medication, & take it as directed and avoid skipping doses. Seek emergency care if your blood pressure is (over 180/100) or you experience chest pain, shortness of breath, or sudden vision changes.Patient verbalizes understanding regarding plan of care and all questions answered.   Orders: -     Lipid panel -     CBC with Differential/Platelet -     BMP8+eGFR  Prediabetes -     Hemoglobin A1c  Lumbar pain  Dysuria Assessment & Plan: Urinalysis, and  urine culture ordered- Awaiting results will follow up. Advise to it's important to drink plenty of water 6-8 glasses daily. Avoid irritants like caffeine, alcohol, spicy foods, and artificial sweeteners, as they can aggravate your bladder.   Orders: -     Urinalysis -     Urine Culture    Return in about 4 months (around 06/29/2023), or if symptoms worsen or fail to improve, for chronic follow-up.   Cruzita Lederer Newman Nip, FNP

## 2023-02-25 NOTE — Patient Instructions (Signed)

## 2023-02-26 ENCOUNTER — Encounter: Payer: Self-pay | Admitting: Family Medicine

## 2023-02-26 ENCOUNTER — Ambulatory Visit (INDEPENDENT_AMBULATORY_CARE_PROVIDER_SITE_OTHER): Payer: PPO | Admitting: Family Medicine

## 2023-02-26 VITALS — BP 105/65 | HR 53 | Ht 64.0 in | Wt 168.1 lb

## 2023-02-26 DIAGNOSIS — R300 Dysuria: Secondary | ICD-10-CM | POA: Diagnosis not present

## 2023-02-26 DIAGNOSIS — M545 Low back pain, unspecified: Secondary | ICD-10-CM | POA: Diagnosis not present

## 2023-02-26 DIAGNOSIS — R7303 Prediabetes: Secondary | ICD-10-CM

## 2023-02-26 DIAGNOSIS — I10 Essential (primary) hypertension: Secondary | ICD-10-CM | POA: Insufficient documentation

## 2023-02-26 NOTE — Assessment & Plan Note (Signed)
Urinalysis, and urine culture ordered- Awaiting results will follow up. Advise to it's important to drink plenty of water 6-8 glasses daily. Avoid irritants like caffeine, alcohol, spicy foods, and artificial sweeteners, as they can aggravate your bladder.

## 2023-02-26 NOTE — Assessment & Plan Note (Signed)
Vitals:   02/26/23 1406  BP: 105/65   Blood pressure controlled in today's visit Continue Metoprolol 25 mg once daily Labs ordered. Discussed with  patient to monitor their blood pressure regularly and maintain a heart-healthy diet rich in fruits, vegetables, whole grains, and low-fat dairy, while reducing sodium intake to less than 2,300 mg per day. Regular physical activity, such as 30 minutes of moderate exercise most days of the week, will help lower blood pressure and improve overall cardiovascular health. Avoiding smoking, limiting alcohol consumption, and managing stress. Take  prescribed medication, & take it as directed and avoid skipping doses. Seek emergency care if your blood pressure is (over 180/100) or you experience chest pain, shortness of breath, or sudden vision changes.Patient verbalizes understanding regarding plan of care and all questions answered.

## 2023-02-26 NOTE — Addendum Note (Signed)
Addended by: Ricardo Jericho R on: 02/26/2023 03:09 PM   Modules accepted: Orders

## 2023-02-27 LAB — CBC WITH DIFFERENTIAL/PLATELET
Basophils Absolute: 0 10*3/uL (ref 0.0–0.2)
Basos: 1 %
EOS (ABSOLUTE): 0.1 10*3/uL (ref 0.0–0.4)
Eos: 2 %
Hematocrit: 44.2 % (ref 37.5–51.0)
Hemoglobin: 15.1 g/dL (ref 13.0–17.7)
Immature Grans (Abs): 0 10*3/uL (ref 0.0–0.1)
Immature Granulocytes: 0 %
Lymphocytes Absolute: 2 10*3/uL (ref 0.7–3.1)
Lymphs: 43 %
MCH: 32.3 pg (ref 26.6–33.0)
MCHC: 34.2 g/dL (ref 31.5–35.7)
MCV: 95 fL (ref 79–97)
Monocytes Absolute: 0.4 10*3/uL (ref 0.1–0.9)
Monocytes: 9 %
Neutrophils Absolute: 2.1 10*3/uL (ref 1.4–7.0)
Neutrophils: 45 %
Platelets: 261 10*3/uL (ref 150–450)
RBC: 4.67 x10E6/uL (ref 4.14–5.80)
RDW: 13.6 % (ref 11.6–15.4)
WBC: 4.6 10*3/uL (ref 3.4–10.8)

## 2023-02-27 LAB — LIPID PANEL
Chol/HDL Ratio: 5 ratio (ref 0.0–5.0)
Cholesterol, Total: 181 mg/dL (ref 100–199)
HDL: 36 mg/dL — ABNORMAL LOW (ref >39)
LDL Chol Calc (NIH): 130 mg/dL — ABNORMAL HIGH (ref 0–99)
Triglycerides: 79 mg/dL (ref 0–149)
VLDL Cholesterol Cal: 15 mg/dL (ref 5–40)

## 2023-02-27 LAB — BMP8+EGFR
BUN/Creatinine Ratio: 20 (ref 10–24)
BUN: 16 mg/dL (ref 8–27)
CO2: 24 mmol/L (ref 20–29)
Calcium: 9.5 mg/dL (ref 8.6–10.2)
Chloride: 104 mmol/L (ref 96–106)
Creatinine, Ser: 0.8 mg/dL (ref 0.76–1.27)
Glucose: 86 mg/dL (ref 70–99)
Potassium: 4.5 mmol/L (ref 3.5–5.2)
Sodium: 140 mmol/L (ref 134–144)
eGFR: 96 mL/min/{1.73_m2} (ref >59)

## 2023-02-27 LAB — HEMOGLOBIN A1C
Est. average glucose Bld gHb Est-mCnc: 123 mg/dL
Hgb A1c MFr Bld: 5.9 % — ABNORMAL HIGH (ref 4.8–5.6)

## 2023-02-28 ENCOUNTER — Other Ambulatory Visit: Payer: Self-pay | Admitting: Family Medicine

## 2023-02-28 LAB — URINE CULTURE

## 2023-02-28 MED ORDER — EZETIMIBE 10 MG PO TABS: 10.0000 mg | ORAL_TABLET | Freq: Every day | ORAL | 3 refills | Status: DC

## 2023-03-01 LAB — URINALYSIS
Bilirubin, UA: NEGATIVE
Glucose, UA: NEGATIVE
Ketones, UA: NEGATIVE
Leukocytes,UA: NEGATIVE
Nitrite, UA: NEGATIVE
Protein,UA: NEGATIVE
Specific Gravity, UA: 1.02 (ref 1.005–1.030)
Urobilinogen, Ur: 0.2 mg/dL (ref 0.2–1.0)
pH, UA: 6 (ref 5.0–7.5)

## 2023-03-27 ENCOUNTER — Other Ambulatory Visit: Payer: Self-pay | Admitting: Internal Medicine

## 2023-03-29 NOTE — Telephone Encounter (Signed)
Please inform patient,  According to clinic protocols, I will approve a refill for Lorazepam 1 mg for this cycle for 60 tablets; however, the next refill will initiate a taper, reducing the quantity to 30 tablets. As lorazepam carries a risk of dependence and habit forming, this approach ensures compliance with safe prescribing practices and prioritizes patient safety for [facility] patients.

## 2023-04-07 NOTE — Telephone Encounter (Signed)
Called to inform. 3rd attempt. Pt. Did not answer. No vm set up.

## 2023-04-21 ENCOUNTER — Telehealth: Payer: Self-pay | Admitting: Family Medicine

## 2023-04-21 ENCOUNTER — Other Ambulatory Visit: Payer: Self-pay | Admitting: Family Medicine

## 2023-04-21 NOTE — Telephone Encounter (Unsigned)
Copied from CRM [id number]. Topic: Clinical - Prescription Issue >> Apr 21, 2023  1:34 PM Carlatta H wrote: Reason for CRM: Jose Ellis called on behalf of patient//Patient is out of meds//She stated he's taking it 3x perday and that's not what's on the dosage//Please call

## 2023-04-21 NOTE — Telephone Encounter (Signed)
REFILLED 3 WEEKS AGO TOO SOON

## 2023-04-21 NOTE — Telephone Encounter (Signed)
Copied from CRM [id number]. Topic: Clinical - Medication Refill >> Apr 21, 2023  1:29 PM Carlatta H wrote: Most Recent Primary Care Visit:  Provider: [facility]  Department: [facility]  Visit Type: OFFICE VISIT  Date: 02/26/2023  Medication: LORazepam (ATIVAN) 1 MG tablet  Has the patient contacted their pharmacy? No (Agent: If no, request that the patient contact the pharmacy for the refill. If patient does not wish to contact the pharmacy document the reason why and proceed with request.) (Agent: If yes, when and what did the pharmacy advise?)  Is this the correct pharmacy for this prescription? Yes If no, delete pharmacy and type the correct one.  This is the patient's preferred pharmacy:  [id number] - [place], [place] - [address] AT [facility] [address] [place] [address] Phone: [phone] Fax: [phone]  [facility] - [place], [place] - [address] [place] [address] Phone: [phone] Fax: [phone]   Has the prescription been filled recently? No  Is the patient out of the medication? Yes  Has the patient been seen for an appointment in the last year OR does the patient have an upcoming appointment?   Can we respond through MyChart?   Agent: Please be advised that Rx refills may take up to 3 business days. We ask that you follow-up with your pharmacy.

## 2023-04-22 ENCOUNTER — Telehealth: Payer: Self-pay

## 2023-04-22 ENCOUNTER — Other Ambulatory Visit: Payer: Self-pay | Admitting: Family Medicine

## 2023-04-22 NOTE — Telephone Encounter (Signed)
I send in 60 tablets and will be weaning to 45 next refill   According to protocols,his next refill will initiate a taper, reducing the quantity to 45 tablets. As Ativan carries a risk of dependence and habit-forming, this approach ensures compliance with safe prescribing practices and prioritizes patient safety for [facility] Primary [facility] patients.

## 2023-04-22 NOTE — Telephone Encounter (Signed)
Copied from CRM [id number]. Topic: Clinical - Prescription Issue >> Apr 22, 2023  3:06 PM Alphonzo Lemmings O wrote: Reason for CRM: patient daughter is saying if it was prescribed as 3 pills a day it wouldn't be a problem. Taking one pill a night he is not getting his sleep and he is still working and doing normal . Please give daughter a call concerning the refill and prescription issue

## 2023-04-23 ENCOUNTER — Encounter: Payer: Self-pay | Admitting: Family Medicine

## 2023-04-23 NOTE — Telephone Encounter (Signed)
To soon for refill.

## 2023-04-23 NOTE — Telephone Encounter (Signed)
Has been addressed in other TE.

## 2023-04-23 NOTE — Telephone Encounter (Signed)
No answer during two attempts. Sent My chart message with physician information.

## 2023-04-23 NOTE — Telephone Encounter (Signed)
Spoke to [place] his daughter , states the medication is Ativan he should be on this 3x per day.

## 2023-04-24 ENCOUNTER — Other Ambulatory Visit: Payer: Self-pay | Admitting: Family Medicine

## 2023-04-26 ENCOUNTER — Telehealth: Payer: Self-pay | Admitting: Family Medicine

## 2023-04-26 NOTE — Telephone Encounter (Signed)
Patient daughter Jose Ellis came by the office asking if provider will send in enough pills for this medication til Friday, 12.27.2024 patient appointment. Patient said was unaware the change of this medication.  Was suppose to tid not 1x day.  Call daughter at [phone] to let her know if meds or not will be called in.  LORazepam (ATIVAN) 1 MG tablet [[id number]]    [facility]: [facility]

## 2023-04-27 ENCOUNTER — Telehealth: Payer: Self-pay | Admitting: Family Medicine

## 2023-04-27 NOTE — Telephone Encounter (Signed)
According to clinic protocols, prescribing 90 tablets of Ativan exceeds the recommended amount. To address this, we will initiate a tapering plan: the current refill will be reduced to 60 tablets, followed by 45 tablets on the next refill, and ultimately down to 30 tablets. This gradual reduction ensures adherence to safe prescribing practices while mitigating the risks of dependence and habit formation associated with Ativan, prioritizing the safety and well-being of Jose Ellis patients

## 2023-04-27 NOTE — Telephone Encounter (Signed)
This is clinical information

## 2023-04-29 NOTE — Progress Notes (Deleted)
   Established Patient Office Visit   Subjective  Patient ID: Marbin Crawn, male    DOB: 1950-12-22  Age: 72 y.o. MRN: [id number]  No chief complaint on file.   He  has a past medical history of Arthritis, Heart palpitations, High cholesterol, and Hypertension.  Insomnia    Review of Systems  Psychiatric/Behavioral:  The patient has insomnia.       Objective:     There were no vitals taken for this visit. {Vitals History (Optional):23777}  Physical Exam   No results found for any visits on 04/30/23.  The 10-year ASCVD risk score (Arnett DK, et al., 2019) is: 16.6%    Assessment & Plan:  There are no diagnoses linked to this encounter.  No follow-ups on file.   Cruzita Lederer Newman Nip, FNP

## 2023-04-29 NOTE — Patient Instructions (Signed)

## 2023-04-30 ENCOUNTER — Other Ambulatory Visit: Payer: Self-pay

## 2023-04-30 ENCOUNTER — Encounter (INDEPENDENT_AMBULATORY_CARE_PROVIDER_SITE_OTHER): Payer: PPO | Admitting: Family Medicine

## 2023-04-30 ENCOUNTER — Telehealth: Payer: Self-pay

## 2023-04-30 ENCOUNTER — Encounter (HOSPITAL_COMMUNITY): Payer: Self-pay | Admitting: Emergency Medicine

## 2023-04-30 ENCOUNTER — Other Ambulatory Visit: Payer: Self-pay | Admitting: Family Medicine

## 2023-04-30 ENCOUNTER — Emergency Department (HOSPITAL_COMMUNITY)
Admission: EM | Admit: 2023-04-30 | Discharge: 2023-04-30 | Disposition: A | Discharge status: Home / Self Care | Payer: PPO | Attending: Emergency Medicine | Admitting: Emergency Medicine

## 2023-04-30 DIAGNOSIS — R002 Palpitations: Secondary | ICD-10-CM | POA: Diagnosis present

## 2023-04-30 DIAGNOSIS — R030 Elevated blood-pressure reading, without diagnosis of hypertension: Secondary | ICD-10-CM | POA: Insufficient documentation

## 2023-04-30 DIAGNOSIS — F13930 Sedative, hypnotic or anxiolytic use, unspecified with withdrawal, uncomplicated: Secondary | ICD-10-CM | POA: Diagnosis not present

## 2023-04-30 DIAGNOSIS — Z20822 Contact with and (suspected) exposure to covid-19: Secondary | ICD-10-CM | POA: Diagnosis not present

## 2023-04-30 DIAGNOSIS — G4700 Insomnia, unspecified: Secondary | ICD-10-CM

## 2023-04-30 LAB — CBC
HCT: 43.6 % (ref 39.0–52.0)
Hemoglobin: 14.9 g/dL (ref 13.0–17.0)
MCH: 31.8 pg (ref 26.0–34.0)
MCHC: 34.2 g/dL (ref 30.0–36.0)
MCV: 93.2 fL (ref 80.0–100.0)
Platelets: 241 10*3/uL (ref 150–400)
RBC: 4.68 MIL/uL (ref 4.22–5.81)
RDW: 13.7 % (ref 11.5–15.5)
WBC: 5.3 10*3/uL (ref 4.0–10.5)
nRBC: 0 % (ref 0.0–0.2)

## 2023-04-30 LAB — BASIC METABOLIC PANEL
Anion gap: 4 — ABNORMAL LOW (ref 5–15)
BUN: 15 mg/dL (ref 8–23)
CO2: 24 mmol/L (ref 22–32)
Calcium: 9 mg/dL (ref 8.9–10.3)
Chloride: 108 mmol/L (ref 98–111)
Creatinine, Ser: 0.65 mg/dL (ref 0.61–1.24)
GFR, Estimated: >60 mL/min (ref >60)
Glucose, Bld: 101 mg/dL — ABNORMAL HIGH (ref 70–99)
Potassium: 3.6 mmol/L (ref 3.5–5.1)
Sodium: 136 mmol/L (ref 135–145)

## 2023-04-30 LAB — RESP PANEL BY RT-PCR (RSV, FLU A&B, COVID)  RVPGX2
Influenza A by PCR: NEGATIVE
Influenza B by PCR: NEGATIVE
Resp Syncytial Virus by PCR: NEGATIVE
SARS Coronavirus 2 by RT PCR: NEGATIVE

## 2023-04-30 LAB — TROPONIN I (HIGH SENSITIVITY): Troponin I (High Sensitivity): 2 ng/L (ref <18)

## 2023-04-30 MED ORDER — LORAZEPAM 1 MG PO TABS
1.0000 mg | ORAL_TABLET | Freq: Once | ORAL | Status: AC
  Administered 2023-04-30: 1 mg via ORAL
  Filled 2023-04-30: qty 1

## 2023-04-30 MED ORDER — LORAZEPAM 1 MG PO TABS: 1.0000 mg | ORAL_TABLET | Freq: Every evening | ORAL | 0 refills | Status: DC | PRN

## 2023-04-30 NOTE — Telephone Encounter (Signed)
I refilled medication  60 tablets for this refill, next month will be 45 tablets

## 2023-04-30 NOTE — Telephone Encounter (Signed)
Copied from CRM [id number]. Topic: Clinical - Medication Question >> Apr 30, 2023 11:39 AM Mosetta Putt H wrote: Reason for CRM: Pt daughter needs a call back regarding LORazepam (ATIVAN) 1 MG tablet there has been reduction In medication and patient has been out of medication for 10 days and going thru withdrawals

## 2023-04-30 NOTE — Telephone Encounter (Signed)
Spoke to [place] the daughter, states he was going into withdrawal had to go to ED today, is also having pain in his back , states he has arthritis in his back and gets unbearable this wasn't addressed at the ED, will keep appt for 05/10/23.

## 2023-04-30 NOTE — Progress Notes (Signed)
Canceled.

## 2023-04-30 NOTE — ED Triage Notes (Signed)
Pt reports palpitations and bilateral shoulder pain. Pt denies any chest pressure or discomfort.

## 2023-04-30 NOTE — Discharge Instructions (Addendum)
It was our pleasure to provide your ER care today - we hope that you feel better.  Drink plenty of fluids/stay well hydrated.   It appears from notes in computer records that your doctor is/has refilled your ativan in terms of working with you to gradually taper off of that medication - call your doctor today to ensure adequate supply and to discuss/clarify the plan for slowly tapering off of that medication.   Also follow up with your doctor regarding the palpitations, and for your blood pressure that is mildly high today. It appears your heart rate and rhythm are currently normal.   Return to ER if worse, new symptoms, fevers, chest pain, trouble breathing, persistent fast heart beating, fainting, or other concern.

## 2023-04-30 NOTE — ED Provider Notes (Signed)
[place] EMERGENCY DEPARTMENT AT [facility] Provider Note   CSN: [id number] Arrival date & time: 04/30/23  7829     History  Chief Complaint  Patient presents with   Palpitations    Jose Ellis is a 72 y.o. male.  Pt with c/o palpitations in past few days. Feels as if is beating fast or skipping beats. Occurs at rest, not related to exertion. No associated chest pain. No sob. No abd pain or nv. At times feels anxious. Indicates ran out of ativan a couple days ago and had been taking 1 mg tid for prolonged time period. No other recent change in meds. Denies leg pain or swelling. No hx cad. No hx dysrhythmia.   The history is provided by the patient, medical records and a relative.  Palpitations Associated symptoms: no chest pain, no cough, no numbness, no shortness of breath, no vomiting and no weakness        Home Medications Prior to Admission medications   Medication Sig Start Date End Date Taking? Authorizing Provider  acetaminophen (TYLENOL) 325 MG tablet Take 650 mg by mouth every 6 (six) hours as needed for mild pain. Patient not taking: Reported on 10/27/2022    [provider]  ezetimibe (ZETIA) 10 MG tablet TAKE 1 TABLET BY MOUTH EVERY DAY 04/26/23   Del Newman Nip, Tenna Child, FNP  fluticasone (FLONASE) 50 MCG/ACT nasal spray Place 2 sprays into both nostrils daily. 09/22/22   [provider]  LORazepam (ATIVAN) 1 MG tablet Take 1 tablet (1 mg total) by mouth at bedtime as needed for anxiety or sleep. 03/29/23   Del Nigel Berthold, FNP  metoprolol tartrate (LOPRESSOR) 25 MG tablet Take 1 tablet (25 mg total) by mouth daily. 10/26/22   Dyann Kief, PA-C  naproxen sodium (ANAPROX) 220 MG tablet Take 440 mg by mouth 2 (two) times daily with a meal.    [provider]  tiZANidine (ZANAFLEX) 4 MG capsule Take 1 capsule (4 mg total) by mouth 2 (two) times daily as needed for muscle spasms. 10/27/22   Del Nigel Berthold,  FNP  tiZANidine (ZANAFLEX) 4 MG tablet Take 4 mg by mouth 2 (two) times daily as needed. 11/11/22   [provider]      Allergies    Aspirin and Penicillins    Review of Systems   Review of Systems  Constitutional:  Negative for chills and fever.  HENT:  Negative for sore throat.   Eyes:  Negative for redness.  Respiratory:  Negative for cough and shortness of breath.   Cardiovascular:  Positive for palpitations. Negative for chest pain and leg swelling.  Gastrointestinal:  Negative for abdominal pain and vomiting.  Genitourinary:  Negative for flank pain.  Musculoskeletal:  Positive for myalgias. Negative for neck pain and neck stiffness.  Skin:  Negative for rash.  Neurological:  Negative for weakness, numbness and headaches.  Psychiatric/Behavioral:  The patient is nervous/anxious.     Physical Exam Updated Vital Signs BP (!) 151/82 (BP Location: Right Arm)   Pulse 62   Temp 98.5 F (36.9 C) (Oral)   Resp 17   Ht 1.626 m (5\' 4" )   Wt 74.8 kg   SpO2 97%   BMI 28.32 kg/m  Physical Exam Vitals and nursing note reviewed.  Constitutional:      Appearance: Normal appearance. He is well-developed.  HENT:     Head: Atraumatic.     Nose: Nose normal.  Mouth/Throat:     Mouth: Mucous membranes are moist.     Pharynx: Oropharynx is clear.  Eyes:     General: No scleral icterus.    Conjunctiva/sclera: Conjunctivae normal.     Pupils: Pupils are equal, round, and reactive to light.  Neck:     Trachea: No tracheal deviation.     Comments: Thyroid not grossly enlarged or tender.  Cardiovascular:     Rate and Rhythm: Normal rate and regular rhythm.     Pulses: Normal pulses.     Heart sounds: Normal heart sounds. No murmur heard.    No friction rub. No gallop.  Pulmonary:     Effort: Pulmonary effort is normal. No accessory muscle usage or respiratory distress.     Breath sounds: Normal breath sounds.  Abdominal:     General: There is no distension.      Palpations: Abdomen is soft.     Tenderness: There is no abdominal tenderness.  Genitourinary:    Comments: No cva tenderness. Musculoskeletal:        General: No swelling or tenderness.     Cervical back: Normal range of motion and neck supple. No rigidity.     Right lower leg: No edema.     Left lower leg: No edema.  Skin:    General: Skin is warm and dry.     Findings: No rash.  Neurological:     Mental Status: He is alert.     Sensory: Sensory deficit present.     Comments: Alert, speech clear. Motor/sens grossly intact bil. Steady gait.   Psychiatric:     Comments: Mildly anxious.      ED Results / Procedures / Treatments   Labs (all labs ordered are listed, but only abnormal results are displayed) Results for orders placed or performed during the hospital encounter of 04/30/23  Resp panel by RT-PCR (RSV, Flu A&B, Covid) Anterior Nasal Swab   Collection Time: 04/30/23  8:28 AM   Specimen: Anterior Nasal Swab  Result Value Ref Range   SARS Coronavirus 2 by RT PCR NEGATIVE NEGATIVE   Influenza A by PCR NEGATIVE NEGATIVE   Influenza B by PCR NEGATIVE NEGATIVE   Resp Syncytial Virus by PCR NEGATIVE NEGATIVE  CBC   Collection Time: 04/30/23  8:54 AM  Result Value Ref Range   WBC 5.3 4.0 - 10.5 K/uL   RBC 4.68 4.22 - 5.81 MIL/uL   Hemoglobin 14.9 13.0 - 17.0 g/dL   HCT 28.4 13.2 - 44.0 %   MCV 93.2 80.0 - 100.0 fL   MCH 31.8 26.0 - 34.0 pg   MCHC 34.2 30.0 - 36.0 g/dL   RDW 10.2 72.5 - 36.6 %   Platelets 241 150 - 400 K/uL   nRBC 0.0 0.0 - 0.2 %  Basic metabolic panel   Collection Time: 04/30/23  8:54 AM  Result Value Ref Range   Sodium 136 135 - 145 mmol/L   Potassium 3.6 3.5 - 5.1 mmol/L   Chloride 108 98 - 111 mmol/L   CO2 24 22 - 32 mmol/L   Glucose, Bld 101 (H) 70 - 99 mg/dL   BUN 15 8 - 23 mg/dL   Creatinine, Ser 4.40 0.61 - 1.24 mg/dL   Calcium 9.0 8.9 - 34.7 mg/dL   GFR, Estimated >42 >59 mL/min   Anion gap 4 (L) 5 - 15  Troponin I (High Sensitivity)    Collection Time: 04/30/23  8:54 AM  Result Value Ref Range  Troponin I (High Sensitivity) 2 <18 ng/L      ED ECG REPORT   Date: 04/30/2023  Rate: 62  Rhythm: normal sinus rhythm  QRS Axis: left  Intervals: normal  ST/T Wave abnormalities: normal  Conduction Disutrbances:none  Narrative Interpretation:   Old EKG Reviewed: none available  I have personally reviewed the EKG tracing  Radiology No results found.  Procedures Procedures    Medications Ordered in ED Medications  LORazepam (ATIVAN) tablet 1 mg (1 mg Oral Given 04/30/23 0846)    ED Course/ Medical Decision Making/ A&P                                 Medical Decision Making Problems Addressed: Benzodiazepine withdrawal without complication [facility]): acute illness or injury with systemic symptoms that poses a threat to life or bodily functions Elevated blood pressure reading: acute illness or injury Palpitations: acute illness or injury with systemic symptoms that poses a threat to life or bodily functions  Amount and/or Complexity of Data Reviewed Independent Historian:     Details: Family/friend, hx External Data Reviewed: notes. Labs: ordered. Decision-making details documented in ED Course. ECG/medicine tests: ordered and independent interpretation performed. Decision-making details documented in ED Course.  Risk Prescription drug management. Decision regarding hospitalization.   Iv ns. Continuous pulse ox and cardiac monitoring. Labs ordered/sent.   Differential diagnosis includes dysrhythmia, pvcs, bzd withdrawal, etc. Dispo decision including potential need for admission considered - will get labs and reassess.   Reviewed nursing notes and prior charts for additional history. External reports reviewed. Additional history from: family/friend.  Prior notes reflect cardiology workup for palpitations - monitoring then showed pac and pvc, but no other dysrhythmia.   Cardiac monitor: sinus rhythm,  rate 66.  Pt/family indicate ran out of ativan which had been on for prolonged period two days ago, was taking 1 mg tid. Discussed need to taper gradually off. Ativan 1 mg po in ED.   Labs reviewed/interpreted by me - wbc and hgb normal. Chem normal. Trop normal. Covid/flu neg.   Recent note in chart about pcp working w patient to taper bzd:    Rec close pcp f/u, including to ensure tapering plan for ativan.   Return precautions provided.          Final Clinical Impression(s) / ED Diagnoses Final diagnoses:  None    Rx / DC Orders ED Discharge Orders     None         Cathren Laine, MD 04/30/23 1031

## 2023-05-03 NOTE — Telephone Encounter (Signed)
Called to inform patient of physician advise was not able to speak personally. Sent a Clinical cytogeneticist message for pt. To review.

## 2023-05-09 NOTE — Patient Instructions (Signed)

## 2023-05-09 NOTE — Progress Notes (Signed)
 Established Patient Office Visit   Subjective  Patient ID: Jose Ellis, male    DOB: [birth year]  Age: 73 y.o. MRN: [id number]  Chief Complaint  Patient presents with   Follow-up    4 mo chronic follow up.  Hospital follow up from 12/24 for palpitations.  Frequent Radiating, burning pain in shoulder.     He  has a past medical history of Arthritis, Heart palpitations, High cholesterol, and Hypertension.  Left shoulder pain Patient reports left shoulder pain stemming from an injury 8 years ago. The pain is a recurrent issue, occurring intermittently and gradually worsening over time. It is described as aching, pounding, and sharp, with a severity of 9/10. Associated symptoms include limited range of motion, numbness, and stiffness. The pain is aggravated by movement and physical activity. The patient has tried acetaminophen  for relief, but it has been ineffective.    Review of Systems  Constitutional:  Negative for chills and fever.  Respiratory:  Negative for sputum production and shortness of breath.   Cardiovascular:  Negative for chest pain.  Musculoskeletal:  Positive for joint pain and myalgias.  Neurological:  Negative for dizziness and headaches.      Objective:     BP 103/69   Pulse 79   Ht 5' 4 (1.626 m)   Wt 169 lb 1.3 oz (76.7 kg)   SpO2 94%   BMI 29.02 kg/m  BP Readings from Last 3 Encounters:  05/10/23 103/69  04/30/23 119/78  02/26/23 105/65      Physical Exam Vitals reviewed.  Constitutional:      General: He is not in acute distress.    Appearance: Normal appearance. He is not ill-appearing, toxic-appearing or diaphoretic.  HENT:     Head: Normocephalic.  Eyes:     General:        Right eye: No discharge.        Left eye: No discharge.     Conjunctiva/sclera: Conjunctivae normal.  Cardiovascular:     Rate and Rhythm: Normal rate.     Pulses: Normal pulses.     Heart sounds: Normal heart sounds.  Pulmonary:     Effort: Pulmonary  effort is normal. No respiratory distress.     Breath sounds: Normal breath sounds.  Musculoskeletal:        General: Normal range of motion.     Right shoulder: No swelling or bony tenderness. Normal range of motion. Normal strength.     Left shoulder: Bony tenderness present. No swelling. Normal range of motion. Decreased strength.  Skin:    General: Skin is warm and dry.     Capillary Refill: Capillary refill takes less than 2 seconds.  Neurological:     General: No focal deficit present.     Mental Status: He is alert and oriented to person, place, and time.     Coordination: Coordination normal.     Gait: Gait normal.  Psychiatric:        Mood and Affect: Mood normal.        Behavior: Behavior normal.      No results found for any visits on 05/10/23.  The 10-year ASCVD risk score (Arnett DK, et al., 2019) is: 16.1%    Assessment & Plan:  Acute pain of left shoulder Assessment & Plan: Xray ordered awaiting results will follow up Trial Robaxin  500 mg PRN Advised to apply heat or cold packs to the shoulder as needed to help manage pain and reduce stiffness. Gentle stretching and  range-of-motion exercises were recommended to maintain mobility and prevent further stiffness. Avoiding activities that aggravate the pain  Orders: -     DG Shoulder Left; Future  Other orders -     Methocarbamol ; Take 1 tablet (500 mg total) by mouth every 12 (twelve) hours.  Dispense: 60 tablet; Refill: 2    Return in about 4 months (around 09/07/2023), or if symptoms worsen or fail to improve, for pre-diabetes, hyperlipidemia.   Hilario Kidd Wilhelmena Falter, FNP

## 2023-05-10 ENCOUNTER — Ambulatory Visit: Payer: Self-pay | Admitting: Family Medicine

## 2023-05-10 ENCOUNTER — Encounter: Payer: Self-pay | Admitting: Family Medicine

## 2023-05-10 VITALS — BP 103/69 | HR 79 | Ht 64.0 in | Wt 169.1 lb

## 2023-05-10 DIAGNOSIS — M25512 Pain in left shoulder: Secondary | ICD-10-CM | POA: Diagnosis not present

## 2023-05-10 MED ORDER — METHOCARBAMOL 500 MG PO TABS: 500.0000 mg | ORAL_TABLET | Freq: Two times a day (BID) | ORAL | 2 refills | Status: DC

## 2023-05-10 NOTE — Assessment & Plan Note (Signed)
 Xray ordered awaiting results will follow up Trial Robaxin  500 mg PRN Advised to apply heat or cold packs to the shoulder as needed to help manage pain and reduce stiffness. Gentle stretching and range-of-motion exercises were recommended to maintain mobility and prevent further stiffness. Avoiding activities that aggravate the pain

## 2023-05-18 ENCOUNTER — Other Ambulatory Visit: Payer: Self-pay | Admitting: Family Medicine

## 2023-05-18 NOTE — Telephone Encounter (Signed)
 Sent 2 weeks ago

## 2023-05-25 ENCOUNTER — Other Ambulatory Visit: Payer: Self-pay | Admitting: Family Medicine

## 2023-05-25 NOTE — Telephone Encounter (Signed)
Refill due on 05/28/23

## 2023-05-30 ENCOUNTER — Other Ambulatory Visit: Payer: Self-pay | Admitting: Family Medicine

## 2023-06-10 ENCOUNTER — Ambulatory Visit (HOSPITAL_COMMUNITY)
Admission: RE | Admit: 2023-06-10 | Discharge: 2023-06-10 | Disposition: A | Discharge status: Home / Self Care | Payer: HMO | Source: Ambulatory Visit | Attending: Family Medicine | Admitting: Family Medicine

## 2023-06-10 DIAGNOSIS — M25512 Pain in left shoulder: Secondary | ICD-10-CM | POA: Diagnosis present

## 2023-06-24 ENCOUNTER — Encounter: Payer: Self-pay | Admitting: Family Medicine

## 2023-08-08 ENCOUNTER — Other Ambulatory Visit: Payer: Self-pay | Admitting: Family Medicine

## 2023-08-10 ENCOUNTER — Other Ambulatory Visit: Payer: Self-pay | Admitting: Family Medicine

## 2023-08-11 ENCOUNTER — Encounter: Payer: Self-pay | Admitting: Family Medicine

## 2023-08-12 ENCOUNTER — Telehealth: Payer: Self-pay

## 2023-08-12 ENCOUNTER — Other Ambulatory Visit: Payer: Self-pay | Admitting: Family Medicine

## 2023-08-12 MED ORDER — METHOCARBAMOL 500 MG PO TABS: 500.0000 mg | ORAL_TABLET | Freq: Two times a day (BID) | ORAL | 2 refills | Status: DC

## 2023-08-12 MED ORDER — LORAZEPAM 1 MG PO TABS: 1.0000 mg | ORAL_TABLET | Freq: Two times a day (BID) | ORAL | 0 refills | Status: DC | PRN

## 2023-08-12 NOTE — Telephone Encounter (Signed)
 I sent the refills now  not sure why it did not go through last time

## 2023-08-12 NOTE — Telephone Encounter (Signed)
 Copied from CRM [id number]. Topic: Clinical - Prescription Issue >> Aug 11, 2023  2:41 PM Fuller Mandril wrote: Reason for CRM: Patient daughter called. Checking status of refill requested by Pharmacy and via MyChart. Patient does not have any medication left and she would like to know if it can be sent in. Let her know request was received and sent too provider for review. Thank You

## 2023-08-12 NOTE — Telephone Encounter (Signed)
Provider sent RX to pharmacy

## 2023-09-09 ENCOUNTER — Ambulatory Visit: Payer: PPO | Admitting: Family Medicine

## 2023-09-16 ENCOUNTER — Ambulatory Visit (INDEPENDENT_AMBULATORY_CARE_PROVIDER_SITE_OTHER): Admitting: Family Medicine

## 2023-09-16 ENCOUNTER — Encounter: Payer: Self-pay | Admitting: Family Medicine

## 2023-09-16 VITALS — BP 107/72 | HR 72 | Ht 64.0 in | Wt 172.8 lb

## 2023-09-16 DIAGNOSIS — E038 Other specified hypothyroidism: Secondary | ICD-10-CM

## 2023-09-16 DIAGNOSIS — R7303 Prediabetes: Secondary | ICD-10-CM | POA: Insufficient documentation

## 2023-09-16 DIAGNOSIS — E559 Vitamin D deficiency, unspecified: Secondary | ICD-10-CM | POA: Diagnosis not present

## 2023-09-16 DIAGNOSIS — E782 Mixed hyperlipidemia: Secondary | ICD-10-CM

## 2023-09-16 MED ORDER — LORAZEPAM 1 MG PO TABS: 1.0000 mg | ORAL_TABLET | Freq: Every day | ORAL | 0 refills | Status: DC | PRN

## 2023-09-16 NOTE — Patient Instructions (Signed)

## 2023-09-16 NOTE — Assessment & Plan Note (Signed)
 Last Hemoglobin A1c: 5.9 Labs: Ordered today, results pending; will follow up accordingly. Reviewed non-pharmacological interventions, including a balanced diet rich in lean proteins, healthy fats, whole grains, and high-fiber vegetables. Emphasized reducing refined sugars and processed carbohydrates, and incorporating more fruits, leafy greens, and legumes.

## 2023-09-16 NOTE — Progress Notes (Signed)
 Established Patient Office Visit   Subjective  Patient ID: Jose Ellis, male    DOB: 05/04/1951  Age: 73 y.o. MRN: [id number]  Chief Complaint  Patient presents with   Medical Management of Chronic Issues    Med refill   Lingering cough since he visited [place]     He  has a past medical history of Arthritis, Heart palpitations, High cholesterol, and Hypertension.  HPI Patient presents to the clinic for chronic follow up. For the details of today's visit, please refer to assessment and plan.   Review of Systems  Constitutional:  Negative for chills and fever.  Respiratory:  Positive for cough. Negative for hemoptysis, sputum production, shortness of breath and wheezing.   Cardiovascular:  Negative for chest pain.  Gastrointestinal:  Negative for abdominal pain.  Neurological:  Negative for dizziness and headaches.      Objective:     BP 107/72   Pulse 72   Ht 5\' 4"  (1.626 m)   Wt 172 lb 12.8 oz (78.4 kg)   SpO2 98%   BMI 29.66 kg/m  BP Readings from Last 3 Encounters:  09/16/23 107/72  05/10/23 103/69  04/30/23 119/78      Physical Exam Vitals reviewed.  Constitutional:      General: He is not in acute distress.    Appearance: Normal appearance. He is not ill-appearing, toxic-appearing or diaphoretic.  HENT:     Head: Normocephalic.  Eyes:     General:        Right eye: No discharge.        Left eye: No discharge.     Conjunctiva/sclera: Conjunctivae normal.  Cardiovascular:     Rate and Rhythm: Normal rate.     Pulses: Normal pulses.     Heart sounds: Normal heart sounds.  Pulmonary:     Effort: Pulmonary effort is normal. No respiratory distress.     Breath sounds: Normal breath sounds.  Abdominal:     General: Bowel sounds are normal.     Palpations: Abdomen is soft.     Tenderness: There is no abdominal tenderness. There is no guarding.  Neurological:     Mental Status: He is alert.     Coordination: Coordination normal.     Gait: Gait  normal.  Psychiatric:        Mood and Affect: Mood normal.        Behavior: Behavior normal.      No results found for any visits on 09/16/23.  The 10-year ASCVD risk score (Arnett DK, et al., 2019) is: 17.1%    Assessment & Plan:  Mixed hyperlipidemia -     BMP8+eGFR -     CBC with Differential/Platelet -     Lipid panel  Prediabetes Assessment & Plan: Last Hemoglobin A1c: 5.9 Labs: Ordered today, results pending; will follow up accordingly. Reviewed non-pharmacological interventions, including a balanced diet rich in lean proteins, healthy fats, whole grains, and high-fiber vegetables. Emphasized reducing refined sugars and processed carbohydrates, and incorporating more fruits, leafy greens, and legumes.    Orders: -     Hemoglobin A1c  TSH (thyroid -stimulating hormone deficiency) -     TSH + free T4  Vitamin D  deficiency -     VITAMIN D  25 Hydroxy (Vit-D Deficiency, Fractures)  Other orders -     LORazepam ; Take 1 tablet (1 mg total) by mouth daily as needed for anxiety or sleep.  Dispense: 30 tablet; Refill: 0    Return in about  6 months (around 03/18/2024), or if symptoms worsen or fail to improve, for chronic follow-up.   Avelino Lek Amber Bail, FNP

## 2023-09-22 DIAGNOSIS — E559 Vitamin D deficiency, unspecified: Secondary | ICD-10-CM | POA: Diagnosis not present

## 2023-09-22 DIAGNOSIS — E782 Mixed hyperlipidemia: Secondary | ICD-10-CM | POA: Diagnosis not present

## 2023-09-22 DIAGNOSIS — E038 Other specified hypothyroidism: Secondary | ICD-10-CM | POA: Diagnosis not present

## 2023-09-22 DIAGNOSIS — R7303 Prediabetes: Secondary | ICD-10-CM | POA: Diagnosis not present

## 2023-09-23 ENCOUNTER — Ambulatory Visit: Payer: Self-pay | Admitting: Family Medicine

## 2023-09-23 LAB — BMP8+EGFR
BUN/Creatinine Ratio: 12 (ref 10–24)
BUN: 21 mg/dL (ref 8–27)
CO2: 24 mmol/L (ref 20–29)
Calcium: 9.5 mg/dL (ref 8.6–10.2)
Chloride: 103 mmol/L (ref 96–106)
Creatinine, Ser: 1.72 mg/dL — ABNORMAL HIGH (ref 0.76–1.27)
Glucose: 73 mg/dL (ref 70–99)
Potassium: 4.1 mmol/L (ref 3.5–5.2)
Sodium: 144 mmol/L (ref 134–144)
eGFR: 42 mL/min/{1.73_m2} — ABNORMAL LOW (ref >59)

## 2023-09-23 LAB — CBC WITH DIFFERENTIAL/PLATELET
Basophils Absolute: 0.1 10*3/uL (ref 0.0–0.2)
Basos: 1 %
EOS (ABSOLUTE): 0.2 10*3/uL (ref 0.0–0.4)
Eos: 4 %
Hematocrit: 39.8 % (ref 37.5–51.0)
Hemoglobin: 12.9 g/dL — ABNORMAL LOW (ref 13.0–17.7)
Immature Grans (Abs): 0 10*3/uL (ref 0.0–0.1)
Immature Granulocytes: 0 %
Lymphocytes Absolute: 1.8 10*3/uL (ref 0.7–3.1)
Lymphs: 32 %
MCH: 32 pg (ref 26.6–33.0)
MCHC: 32.4 g/dL (ref 31.5–35.7)
MCV: 99 fL — ABNORMAL HIGH (ref 79–97)
Monocytes Absolute: 0.4 10*3/uL (ref 0.1–0.9)
Monocytes: 8 %
Neutrophils Absolute: 3.1 10*3/uL (ref 1.4–7.0)
Neutrophils: 55 %
Platelets: 177 10*3/uL (ref 150–450)
RBC: 4.03 x10E6/uL — ABNORMAL LOW (ref 4.14–5.80)
RDW: 12.1 % (ref 11.6–15.4)
WBC: 5.6 10*3/uL (ref 3.4–10.8)

## 2023-09-23 LAB — LIPID PANEL
Chol/HDL Ratio: 2.9 ratio (ref 0.0–5.0)
Cholesterol, Total: 134 mg/dL (ref 100–199)
HDL: 47 mg/dL (ref >39)
LDL Chol Calc (NIH): 53 mg/dL (ref 0–99)
Triglycerides: 208 mg/dL — ABNORMAL HIGH (ref 0–149)
VLDL Cholesterol Cal: 34 mg/dL (ref 5–40)

## 2023-09-23 LAB — TSH+FREE T4
Free T4: 1.18 ng/dL (ref 0.82–1.77)
TSH: 0.739 u[IU]/mL (ref 0.450–4.500)

## 2023-09-23 LAB — HEMOGLOBIN A1C
Est. average glucose Bld gHb Est-mCnc: 111 mg/dL
Hgb A1c MFr Bld: 5.5 % (ref 4.8–5.6)

## 2023-09-23 LAB — VITAMIN D 25 HYDROXY (VIT D DEFICIENCY, FRACTURES): Vit D, 25-Hydroxy: 50.5 ng/mL (ref 30.0–100.0)

## 2023-09-24 ENCOUNTER — Other Ambulatory Visit: Payer: Self-pay | Admitting: Family Medicine

## 2023-11-06 ENCOUNTER — Other Ambulatory Visit: Payer: Self-pay | Admitting: Family Medicine

## 2023-12-17 ENCOUNTER — Other Ambulatory Visit: Payer: Self-pay | Admitting: Physician Assistant

## 2023-12-20 ENCOUNTER — Other Ambulatory Visit: Payer: Self-pay | Admitting: Family Medicine

## 2023-12-21 ENCOUNTER — Other Ambulatory Visit: Payer: Self-pay | Admitting: Family Medicine

## 2024-03-09 ENCOUNTER — Other Ambulatory Visit: Payer: Self-pay | Admitting: Family Medicine

## 2024-03-13 ENCOUNTER — Other Ambulatory Visit: Payer: Self-pay | Admitting: Family Medicine

## 2024-03-18 ENCOUNTER — Other Ambulatory Visit: Payer: Self-pay | Admitting: Physician Assistant

## 2024-03-20 ENCOUNTER — Encounter: Payer: Self-pay | Admitting: Family Medicine

## 2024-03-20 ENCOUNTER — Ambulatory Visit: Admitting: Family Medicine

## 2024-03-20 VITALS — BP 118/71 | HR 71 | Ht 64.0 in | Wt 178.0 lb

## 2024-03-20 DIAGNOSIS — R7303 Prediabetes: Secondary | ICD-10-CM | POA: Diagnosis not present

## 2024-03-20 DIAGNOSIS — Z23 Encounter for immunization: Secondary | ICD-10-CM | POA: Diagnosis not present

## 2024-03-20 DIAGNOSIS — I10 Essential (primary) hypertension: Secondary | ICD-10-CM

## 2024-03-20 NOTE — Progress Notes (Signed)
 Established Patient Office Visit  Subjective:  Patient ID: Jose Ellis, male    DOB: 10/23/1950  Age: 73 y.o. MRN: [id number]  CC:  Chief Complaint  Patient presents with   Hypertension    Six month follow up    HPI Jose Ellis is a 73 y.o. male with past medical history of  HTN, prediabetes presents for f/u of  chronic medical conditions.  For the details of today's visit, please refer to the assessment and plan.     Past Medical History:  Diagnosis Date   Arthritis    Heart palpitations    High cholesterol    Hypertension     Past Surgical History:  Procedure Laterality Date   NO PAST SURGERIES      Family History  Problem Relation Age of Onset   Heart attack Brother     Social History   Socioeconomic History   Marital status: Married    Spouse name: Not on file   Number of children: Not on file   Years of education: Not on file   Highest education level: Not on file  Occupational History   Not on file  Tobacco Use   Smoking status: Former   Smokeless tobacco: Never  Vaping Use   Vaping status: Never Used  Substance and Sexual Activity   Alcohol use: No    Comment: on weekends   Drug use: No   Sexual activity: Not on file  Other Topics Concern   Not on file  Social History Narrative   Not on file   Social Drivers of Health   Financial Resource Strain: Not on file  Food Insecurity: Not on file  Transportation Needs: Not on file  Physical Activity: Not on file  Stress: Not on file  Social Connections: Not on file  Intimate Partner Violence: Not on file    Outpatient Medications Prior to Visit  Medication Sig Dispense Refill   acetaminophen  (TYLENOL ) 325 MG tablet Take 650 mg by mouth every 6 (six) hours as needed for mild pain. (Patient not taking: Reported on 10/27/2022)     atorvastatin  (LIPITOR) 20 MG tablet TAKE 1 TABLET BY MOUTH EVERY DAY 90 tablet 1   ezetimibe  (ZETIA ) 10 MG tablet TAKE 1 TABLET BY MOUTH EVERY DAY 90 tablet 1    fluticasone (FLONASE) 50 MCG/ACT nasal spray Place 2 sprays into both nostrils daily.     LORazepam  (ATIVAN ) 1 MG tablet TAKE 1 TABLET (1 MG TOTAL) BY MOUTH DAILY AS NEEDED FOR ANXIETY OR SLEEP. 30 tablet 0   methocarbamol  (ROBAXIN ) 500 MG tablet TAKE 1 TABLET BY MOUTH EVERY 12 HOURS 60 tablet 2   metoprolol  tartrate (LOPRESSOR ) 25 MG tablet TAKE 1 TABLET (25 MG TOTAL) BY MOUTH DAILY. 90 tablet 0   naproxen sodium (ANAPROX) 220 MG tablet Take 440 mg by mouth 2 (two) times daily with a meal. (Patient not taking: Reported on 05/10/2023)     tiZANidine  (ZANAFLEX ) 4 MG tablet Take 4 mg by mouth 2 (two) times daily as needed.     No facility-administered medications prior to visit.    Allergies  Allergen Reactions   Aspirin      Pt states it burns his stomach   Penicillins     Was told as a child he was allegic    ROS Review of Systems  Constitutional:  Negative for fatigue and fever.  Eyes:  Negative for visual disturbance.  Respiratory:  Negative for chest tightness and shortness of breath.  Cardiovascular:  Negative for chest pain and palpitations.  Neurological:  Negative for dizziness and headaches.      Objective:    Physical Exam HENT:     Head: Normocephalic.     Right Ear: External ear normal.     Left Ear: External ear normal.     Nose: No congestion or rhinorrhea.     Mouth/Throat:     Mouth: Mucous membranes are moist.  Cardiovascular:     Rate and Rhythm: Regular rhythm.     Heart sounds: No murmur heard. Pulmonary:     Effort: No respiratory distress.     Breath sounds: Normal breath sounds.  Neurological:     Mental Status: He is alert.     BP 118/71   Pulse 71   Ht 5' 4 (1.626 m)   Wt 178 lb (80.7 kg)   SpO2 97%   BMI 30.55 kg/m  Wt Readings from Last 3 Encounters:  03/20/24 178 lb (80.7 kg)  09/16/23 172 lb 12.8 oz (78.4 kg)  05/10/23 169 lb 1.3 oz (76.7 kg)    Lab Results  Component Value Date   TSH 0.739 09/22/2023   Lab Results   Component Value Date   WBC 5.6 09/22/2023   HGB 12.9 (L) 09/22/2023   HCT 39.8 09/22/2023   MCV 99 (H) 09/22/2023   PLT 177 09/22/2023   Lab Results  Component Value Date   NA 144 09/22/2023   K 4.1 09/22/2023   CO2 24 09/22/2023   GLUCOSE 73 09/22/2023   BUN 21 09/22/2023   CREATININE 1.72 (H) 09/22/2023   BILITOT 0.5 10/28/2022   ALKPHOS 101 10/28/2022   AST 18 10/28/2022   ALT 18 10/28/2022   PROT 6.7 10/28/2022   ALBUMIN 4.3 10/28/2022   CALCIUM  9.5 09/22/2023   ANIONGAP 4 (L) 04/30/2023   EGFR 42 (L) 09/22/2023   Lab Results  Component Value Date   CHOL 134 09/22/2023   Lab Results  Component Value Date   HDL 47 09/22/2023   Lab Results  Component Value Date   LDLCALC 53 09/22/2023   Lab Results  Component Value Date   TRIG 208 (H) 09/22/2023   Lab Results  Component Value Date   CHOLHDL 2.9 09/22/2023   Lab Results  Component Value Date   HGBA1C 5.5 09/22/2023      Assessment & Plan:  Primary hypertension Assessment & Plan: Controlled Continue treatment regimen as is BP Readings from Last 3 Encounters:  03/20/24 118/71  09/16/23 107/72  05/10/23 103/69       Prediabetes Assessment & Plan: Lifestyle modifications for prediabetes were discussed, including adopting a heart-healthy diet and increasing physical activity. The patient was encouraged to decrease her intake of high-sugar foods and beverages. She verbalized understanding and is aware of the plan of care.    Encounter for immunization Assessment & Plan: Patient educated on CDC recommendation for the vaccine. Verbal consent was obtained from the patient, vaccine administered by nurse, no sign of adverse reactions noted at this time. Patient education on arm soreness and use of tylenol  or ibuprofen for this patient  was discussed. Patient educated on the signs and symptoms of adverse effect and advise to contact the office if they occur.   Orders: -     Flu vaccine HIGH DOSE  PF(Fluzone Trivalent)   Note: This chart has been completed using [employer] (consulting) software, and while attempts have been made to ensure accuracy, certain words and phrases may  not be transcribed as intended.   Follow-up: Return in about 4 months (around 07/18/2024).   Thales Knipple  Z Bacchus, FNP

## 2024-03-20 NOTE — Assessment & Plan Note (Addendum)
 Controlled Continue treatment regimen as is BP Readings from Last 3 Encounters:  03/20/24 118/71  09/16/23 107/72  05/10/23 103/69

## 2024-03-20 NOTE — Patient Instructions (Signed)
 I appreciate the opportunity to provide care to you today!    Follow up:  4 months  Labs:  next visit  For a Healthier YOU, I Recommend: Reducing your intake of sugar, sodium, carbohydrates, and saturated fats. Increasing your fiber intake by incorporating more whole grains, fruits, and vegetables into your meals. Setting healthy goals with a focus on lowering your consumption of carbs, sugar, and unhealthy fats. Adding variety to your diet by including a wide range of fruits and vegetables. Cutting back on soda and limiting processed foods as much as possible. Staying active: In addition to taking your weight loss medication, aim for at least 150 minutes of moderate-intensity physical activity each week for optimal results.    Please follow up if your symptoms worsen or fail to improve.     Please continue to a heart-healthy diet and increase your physical activities. Try to exercise for at least five days a week.    It was a pleasure to see you and I look forward to continuing to work together on your health and well-being. Please do not hesitate to call the office if you need care or have questions about your care.  In case of emergency, please visit the Emergency Department for urgent care, or contact our clinic at [phone] to schedule an appointment. We're here to help you!   Have a wonderful day and week. With Gratitude, Meade JENEANE Gerlach MSN, FNP-BC, PMHNP-BC

## 2024-03-20 NOTE — Assessment & Plan Note (Signed)
 Patient educated on CDC recommendation for the vaccine. Verbal consent was obtained from the patient, vaccine administered by nurse, no sign of adverse reactions noted at this time. Patient education on arm soreness and use of tylenol or ibuprofen for this patient  was discussed. Patient educated on the signs and symptoms of adverse effect and advise to contact the office if they occur.

## 2024-03-20 NOTE — Assessment & Plan Note (Signed)
 Lifestyle modifications for prediabetes were discussed, including adopting a heart-healthy diet and increasing physical activity. The patient was encouraged to decrease her intake of high-sugar foods and beverages. She verbalized understanding and is aware of the plan of care.

## 2024-03-21 NOTE — Progress Notes (Unsigned)
 Cardiology Office Note    Date:  03/22/2024  ID:  Jose Ellis, DOB 27-Apr-1951, MRN [id number] Cardiologist: Alvan Carrier, MD Cardiology APP:  Johnson Laymon HERO, PA-C { :  History of Present Illness:    Jose Ellis is a 73 y.o. male with past medical history of palpitations (PAC's and PVC's by prior monitor), HLD and prior tobacco use who presents to the office today for annual follow-up.  He was last examined by Olivia Pavy, PA in 10/2022 and had recently worn a monitor which showed PAC's and PVC's with less than 1% burden but no significant arrhythmias. He had reduced his caffeine intake and was continued on PRN Lopressor .   In talking with the patient today, he reports doing well since his last office visit. He remains active at baseline in doing chores around his home, including frequent yard work. Also continues to work part-time at [employer]. He denies any recent chest pain or dyspnea on exertion when doing routine activities. Reports feeling palpitations in 04/2023 but this occurred after having been without his Ativan . He denies any recurrence of symptoms since. He does take Lopressor  12.5 mg as needed but says palpitations have overall been well-controlled. He limits his caffeine intake and does not consume alcohol.  Studies Reviewed:   EKG: EKG is ordered today and demonstrates:   EKG Interpretation Date/Time:  Wednesday March 22 2024 14:16:07 EST Ventricular Rate:  66 PR Interval:  170 QRS Duration:  80 QT Interval:  384 QTC Calculation: 402 R Axis:   -44  Text Interpretation: Normal sinus rhythm Left axis deviation  TWI along Lead III as noted on prior tracings. No acute ST changes. Confirmed by Johnson Laymon ([id number]) on 03/22/2024 2:20:15 PM       Carotid Dopplers: 04/2022 IMPRESSION: Minimal to moderate amount of bilateral atherosclerotic plaque, progressed compared to the 2014 examination though not resulting in a  hemodynamically significant stenosis within either internal carotid artery.  Event Monitor: 09/2022   7 day monitor   Rare supraventricular ectopy in the form of isolated PACs   Rare ventricular ectopy in the form of isolated PVCs   Reported symptoms correlated with normal sinus rhythm     Patch Wear Time:  6 days and 23 hours (2024-05-10T10:41:53-0400 to 2024-05-17T10:24:28-0400)   Patient had a min HR of 39 bpm, max HR of 122 bpm, and avg HR of 67 bpm. Predominant underlying rhythm was Sinus Rhythm. Isolated SVEs were rare (<1.0%), and no SVE Couplets or SVE Triplets were present. Isolated VEs were rare (<1.0%), and no VE Couplets  or VE Triplets were present.    Physical Exam:   VS:  BP 112/70 (BP Location: Right Arm, Cuff Size: Normal)   Pulse 65   Wt 177 lb 6.4 oz (80.5 kg)   SpO2 95%   BMI 30.45 kg/m    Wt Readings from Last 3 Encounters:  03/22/24 177 lb 6.4 oz (80.5 kg)  03/20/24 178 lb (80.7 kg)  09/16/23 172 lb 12.8 oz (78.4 kg)     GEN: Well nourished, well developed male appearing in no acute distress NECK: No JVD; No carotid bruits CARDIAC: RRR, no murmurs, rubs, gallops RESPIRATORY:  Clear to auscultation without rales, wheezing or rhonchi  ABDOMEN: Appears non-distended. No obvious abdominal masses. EXTREMITIES: No clubbing or cyanosis. No pitting edema.  Distal pedal pulses are 2+ bilaterally.   Assessment and Plan:   1. Palpitations - His prior monitor in 09/2022 showed predominantly normal sinus rhythm with rare  PAC's and PVC's but less than 1% burden.  Symptoms have overall been well-controlled and can he continues to limit caffeine intake.  - Will continue with Lopressor  12.5 mg to take as needed for palpitations. I encouraged him to make us  aware if symptoms increase in frequency or severity.   2. Mixed hyperlipidemia - Followed by his PCP. FLP in 09/2023 showed total cholesterol 134, triglycerides 208 and LDL 53. Would continue to aim for goal LDL  less than 70 given documentation of carotid plaque by prior imaging. Continue current medical therapy with Atorvastatin  20 mg daily and Zetia  10 mg daily.   Signed, Laymon CHRISTELLA Qua, PA-C

## 2024-03-22 ENCOUNTER — Encounter: Payer: Self-pay | Admitting: Student

## 2024-03-22 ENCOUNTER — Ambulatory Visit: Attending: Student | Admitting: Student

## 2024-03-22 VITALS — BP 112/70 | HR 65 | Wt 177.4 lb

## 2024-03-22 DIAGNOSIS — R002 Palpitations: Secondary | ICD-10-CM | POA: Diagnosis not present

## 2024-03-22 DIAGNOSIS — E782 Mixed hyperlipidemia: Secondary | ICD-10-CM

## 2024-03-22 MED ORDER — METOPROLOL TARTRATE 25 MG PO TABS: 12.5000 mg | ORAL_TABLET | Freq: Every day | ORAL | 3 refills | Status: AC | PRN

## 2024-03-22 NOTE — Patient Instructions (Signed)

## 2024-04-12 ENCOUNTER — Other Ambulatory Visit: Payer: Self-pay | Admitting: Family Medicine

## 2024-04-12 DIAGNOSIS — F419 Anxiety disorder, unspecified: Secondary | ICD-10-CM

## 2024-04-12 MED ORDER — LORAZEPAM 1 MG PO TABS: 1.0000 mg | ORAL_TABLET | ORAL | 0 refills | Status: DC | PRN

## 2024-05-08 ENCOUNTER — Other Ambulatory Visit: Payer: Self-pay | Admitting: Family Medicine

## 2024-05-11 ENCOUNTER — Other Ambulatory Visit: Payer: Self-pay | Admitting: Family Medicine

## 2024-05-11 DIAGNOSIS — F419 Anxiety disorder, unspecified: Secondary | ICD-10-CM

## 2024-05-21 ENCOUNTER — Encounter (HOSPITAL_COMMUNITY): Payer: Self-pay | Admitting: Emergency Medicine

## 2024-05-21 ENCOUNTER — Emergency Department (HOSPITAL_COMMUNITY)

## 2024-05-21 ENCOUNTER — Emergency Department (HOSPITAL_COMMUNITY)
Admission: EM | Admit: 2024-05-21 | Discharge: 2024-05-22 | Disposition: A | Attending: Emergency Medicine | Admitting: Emergency Medicine

## 2024-05-21 ENCOUNTER — Other Ambulatory Visit: Payer: Self-pay

## 2024-05-21 DIAGNOSIS — R030 Elevated blood-pressure reading, without diagnosis of hypertension: Secondary | ICD-10-CM | POA: Insufficient documentation

## 2024-05-21 DIAGNOSIS — M549 Dorsalgia, unspecified: Secondary | ICD-10-CM | POA: Insufficient documentation

## 2024-05-21 DIAGNOSIS — R519 Headache, unspecified: Secondary | ICD-10-CM | POA: Diagnosis not present

## 2024-05-21 DIAGNOSIS — R110 Nausea: Secondary | ICD-10-CM | POA: Insufficient documentation

## 2024-05-21 DIAGNOSIS — K5790 Diverticulosis of intestine, part unspecified, without perforation or abscess without bleeding: Secondary | ICD-10-CM | POA: Insufficient documentation

## 2024-05-21 DIAGNOSIS — R0789 Other chest pain: Secondary | ICD-10-CM

## 2024-05-21 DIAGNOSIS — R079 Chest pain, unspecified: Secondary | ICD-10-CM | POA: Diagnosis present

## 2024-05-21 LAB — COMPREHENSIVE METABOLIC PANEL WITH GFR
ALT: 23 U/L (ref 0–44)
AST: 20 U/L (ref 15–41)
Albumin: 4.2 g/dL (ref 3.5–5.0)
Alkaline Phosphatase: 108 U/L (ref 38–126)
Anion gap: 12 (ref 5–15)
BUN: 15 mg/dL (ref 8–23)
CO2: 22 mmol/L (ref 22–32)
Calcium: 9.9 mg/dL (ref 8.9–10.3)
Chloride: 104 mmol/L (ref 98–111)
Creatinine, Ser: 0.83 mg/dL (ref 0.61–1.24)
GFR, Estimated: >60 mL/min
Glucose, Bld: 94 mg/dL (ref 70–99)
Potassium: 4 mmol/L (ref 3.5–5.1)
Sodium: 138 mmol/L (ref 135–145)
Total Bilirubin: 0.3 mg/dL (ref 0.0–1.2)
Total Protein: 7.3 g/dL (ref 6.5–8.1)

## 2024-05-21 LAB — CBC WITH DIFFERENTIAL/PLATELET
Abs Immature Granulocytes: 0.02 K/uL (ref 0.00–0.07)
Basophils Absolute: 0 K/uL (ref 0.0–0.1)
Basophils Relative: 1 %
Eosinophils Absolute: 0.1 K/uL (ref 0.0–0.5)
Eosinophils Relative: 1 %
HCT: 45.6 % (ref 39.0–52.0)
Hemoglobin: 15.7 g/dL (ref 13.0–17.0)
Immature Granulocytes: 0 %
Lymphocytes Relative: 34 %
Lymphs Abs: 2 K/uL (ref 0.7–4.0)
MCH: 31.9 pg (ref 26.0–34.0)
MCHC: 34.4 g/dL (ref 30.0–36.0)
MCV: 92.7 fL (ref 80.0–100.0)
Monocytes Absolute: 0.6 K/uL (ref 0.1–1.0)
Monocytes Relative: 10 %
Neutro Abs: 3.2 K/uL (ref 1.7–7.7)
Neutrophils Relative %: 54 %
Platelets: 280 K/uL (ref 150–400)
RBC: 4.92 MIL/uL (ref 4.22–5.81)
RDW: 13.6 % (ref 11.5–15.5)
WBC: 5.8 K/uL (ref 4.0–10.5)
nRBC: 0 % (ref 0.0–0.2)

## 2024-05-21 LAB — LIPASE, BLOOD: Lipase: 17 U/L (ref 11–51)

## 2024-05-21 LAB — TROPONIN T, HIGH SENSITIVITY: Troponin T High Sensitivity: <15 ng/L (ref 0–19)

## 2024-05-21 MED ORDER — ONDANSETRON HCL 4 MG/2ML IJ SOLN
4.0000 mg | Freq: Once | INTRAMUSCULAR | Status: AC
  Administered 2024-05-21: 4 mg via INTRAVENOUS
  Filled 2024-05-21: qty 2

## 2024-05-21 MED ORDER — IOHEXOL 350 MG/ML SOLN
100.0000 mL | Freq: Once | INTRAVENOUS | Status: AC | PRN
  Administered 2024-05-21: 100 mL via INTRAVENOUS

## 2024-05-21 MED ORDER — MORPHINE SULFATE (PF) 4 MG/ML IV SOLN
4.0000 mg | Freq: Once | INTRAVENOUS | Status: AC
  Administered 2024-05-21: 4 mg via INTRAVENOUS
  Filled 2024-05-21: qty 1

## 2024-05-21 NOTE — ED Triage Notes (Signed)
 Pt arrives to ED via POV c/o hypertension at home for the past 2 hours. Pt took home HTN meds at 1930 with no improvement. Pt c/o fatigue, bloating, headache and slight chest pressure that all started at about 1900 tonight.

## 2024-05-21 NOTE — ED Provider Notes (Signed)
 " [facility] AT [facility] Provider Note   CSN: [id number] Arrival date & time: 05/21/24  2138     Patient presents with: Hypertension   Jose Ellis is a 74 y.o. male.  He is presenting with complaint of elevated blood pressure for the last few hours.  He said he was feeling bloated with some discomfort in his upper back and chest associated with nausea.  Checked his blood pressure and found it to be elevated.  Blood pressures normally 110/70.  Tried some as needed metoprolol  that he has for palpitations that did not change his blood pressure.  No history of coronary disease but does follow with cardiology for palpitations and hyperlipidemia.  Non-smoker no alcohol.  {Add pertinent medical, surgical, social history, OB history to YEP:67052} The history is provided by the patient and the spouse.  Hypertension This is a new problem. The current episode started 1 to 2 hours ago. The problem occurs constantly. The problem has not changed since onset.Associated symptoms include chest pain and headaches. Pertinent negatives include no abdominal pain and no shortness of breath. Nothing aggravates the symptoms. Nothing relieves the symptoms. Treatments tried: metoprolol . The treatment provided no relief.       Prior to Admission medications  Medication Sig Start Date End Date Taking? Authorizing Provider  acetaminophen  (TYLENOL ) 325 MG tablet Take 650 mg by mouth every 6 (six) hours as needed for mild pain.    [provider]  atorvastatin  (LIPITOR) 20 MG tablet TAKE 1 TABLET BY MOUTH EVERY DAY 05/08/24   Del Orbe Polanco, Iliana, FNP  ezetimibe  (ZETIA ) 10 MG tablet TAKE 1 TABLET BY MOUTH EVERY DAY 04/26/23   Del Orbe Polanco, Hilario, FNP  LORazepam  (ATIVAN ) 1 MG tablet TAKE 1 TABLET BY MOUTH AS NEEDED FOR ANXIETY. 05/13/24   Bacchus, Gloria Z, FNP  methocarbamol  (ROBAXIN ) 500 MG tablet TAKE 1 TABLET BY MOUTH EVERY 12 HOURS 03/09/24   Bevely Doffing, FNP   metoprolol  tartrate (LOPRESSOR ) 25 MG tablet Take 0.5 tablets (12.5 mg total) by mouth daily as needed (Palpitations). 03/22/24   Strader, Laymon HERO, PA-C  tiZANidine  (ZANAFLEX ) 4 MG tablet Take 4 mg by mouth 2 (two) times daily as needed. 11/11/22   [provider]    Allergies: Aspirin  and Penicillins    Review of Systems  Constitutional:  Negative for fever.  Respiratory:  Negative for shortness of breath.   Cardiovascular:  Positive for chest pain.  Gastrointestinal:  Positive for nausea. Negative for abdominal pain.  Musculoskeletal:  Positive for back pain.  Neurological:  Positive for headaches.    Updated Vital Signs BP (!) 162/91   Pulse 63   Temp 97.7 F (36.5 C) (Temporal)   Resp 18   Ht 5' 4 (1.626 m)   Wt 80.5 kg   SpO2 99%   BMI 30.46 kg/m   Physical Exam Vitals and nursing note reviewed.  Constitutional:      General: He is not in acute distress.    Appearance: Normal appearance. He is well-developed.  HENT:     Head: Normocephalic and atraumatic.  Eyes:     Conjunctiva/sclera: Conjunctivae normal.  Cardiovascular:     Rate and Rhythm: Normal rate and regular rhythm.     Heart sounds: No murmur heard. Pulmonary:     Effort: Pulmonary effort is normal. No respiratory distress.     Breath sounds: Normal breath sounds.  Abdominal:     Palpations: Abdomen is soft.  Tenderness: There is no abdominal tenderness. There is no guarding or rebound.  Musculoskeletal:        General: No deformity.     Cervical back: Neck supple.  Skin:    General: Skin is warm and dry.     Capillary Refill: Capillary refill takes less than 2 seconds.  Neurological:     General: No focal deficit present.     Mental Status: He is alert.     Cranial Nerves: No cranial nerve deficit.     Sensory: No sensory deficit.     Motor: No weakness.     (all labs ordered are listed, but only abnormal results are displayed) Labs Reviewed  COMPREHENSIVE METABOLIC PANEL  WITH GFR  LIPASE, BLOOD  CBC WITH DIFFERENTIAL/PLATELET  TROPONIN T, HIGH SENSITIVITY    EKG: None  Radiology: No results found.  {Document cardiac monitor, telemetry assessment procedure when appropriate:32947} Procedures   Medications Ordered in the ED  ondansetron  (ZOFRAN ) injection 4 mg (has no administration in time range)      {Click here for ABCD2, HEART and other calculators REFRESH Note before signing:1}                              Medical Decision Making Amount and/or Complexity of Data Reviewed Labs: ordered. Radiology: ordered.  Risk Prescription drug management.   This patient complains of ***; this involves an extensive number of treatment Options and is a complaint that carries with it a high risk of complications and morbidity. The differential includes ***  I ordered, reviewed and interpreted labs, which included *** I ordered medication *** and reviewed PMP when indicated. I ordered imaging studies which included *** and I independently    visualized and interpreted imaging which showed *** Additional history obtained from *** Previous records obtained and reviewed *** I consulted *** and discussed lab and imaging findings and discussed disposition.  Cardiac monitoring reviewed, *** Social determinants considered, *** Critical Interventions: ***  After the interventions stated above, I reevaluated the patient and found *** Admission and further testing considered, ***   {Document critical care time when appropriate  Document review of labs and clinical decision tools ie CHADS2VASC2, etc  Document your independent review of radiology images and any outside records  Document your discussion with family members, caretakers and with consultants  Document social determinants of health affecting pt's care  Document your decision making why or why not admission, treatments were needed:32947:::1}   Final diagnoses:  None    ED Discharge Orders      None        "

## 2024-05-22 LAB — TROPONIN T, HIGH SENSITIVITY: Troponin T High Sensitivity: <15 ng/L (ref 0–19)

## 2024-05-22 NOTE — Discharge Instructions (Signed)
 Your test results today were reassuring.  Continue to monitor your blood pressure at home when you are calm and relaxed.  Follow-up with your primary care doctor and cardiology office.

## 2024-05-23 NOTE — Progress Notes (Unsigned)
 "  Cardiology Office Note    Date:  05/25/2024  ID:  Tab Colantonio, DOB May 20, 1950, MRN [id number] Cardiologist: Alvan Carrier, MD Cardiology APP:  Johnson Laymon HERO, PA-C { : History of Present Illness:    Jose Ellis is a 74 y.o. male with past medical history of palpitations (PAC's and PVC's by prior monitor), HLD and prior tobacco use who presents to the office today for follow-up from a recent Emergency Department visit.   He was last examined by myself in 03/2024 and remained active at baseline and denied any anginal symptoms. He did report occasional palpitations and was taking Lopressor  as needed with improvement. No changes were made to his cardiac medications and he was continued on Lopressor  12.5 mg as needed, Atorvastatin  20 mg daily and Zetia  10 mg daily.  In the interim, he presented to [facility] ED on 05/21/2024 for evaluation of elevated blood pressure with associated fatigue, headache and chest pressure. BP was elevated at 162/91 while in the ED. Troponin T values were negative at less than 15 and EKG showed no acute ST changes. CTA showed no evidence of acute aortic syndrome. He was noted to have atherosclerotic disease of the aorta and a 3 mm right middle lobe pulmonary nodule with no routine follow-up recommended. Was discharged home and informed to follow-up with Cardiology and his PCP as an outpatient.  In talking with the patient today, he reports he has overall been feeling well since recent ED evaluation. He did have pain along his neck but this improved with Tylenol .  Says that he was having headaches the day of ED evaluation but had also quit consuming caffeine. He has been checking his blood pressure at home since and this has overall been well-controlled with SBP typically in the 110's. Says that he does not take Lopressor  regularly as he previously had hypotension with this. No recent chest pain, palpitations, orthopnea, PND or pitting edema. Says that he more  felt bloated along his abdomen at the time of ED evaluation and not his chest. Says that he had consumed pizza the night prior to this occurring as well and he usually tries to limit his sodium intake.  Studies Reviewed:   EKG: EKG is not ordered today. EKG from 05/21/2024 is reviewed and shows NSR, HR 64 with borderline LVH and no acute ST changes.   Event Monitor: 09/2022   7 day monitor   Rare supraventricular ectopy in the form of isolated PACs   Rare ventricular ectopy in the form of isolated PVCs   Reported symptoms correlated with normal sinus rhythm     Patch Wear Time:  6 days and 23 hours (2024-05-10T10:41:53-0400 to 2024-05-17T10:24:28-0400)   Patient had a min HR of 39 bpm, max HR of 122 bpm, and avg HR of 67 bpm. Predominant underlying rhythm was Sinus Rhythm. Isolated SVEs were rare (<1.0%), and no SVE Couplets or SVE Triplets were present. Isolated VEs were rare (<1.0%), and no VE Couplets  or VE Triplets were present.    Physical Exam:   VS:  BP 116/74   Pulse 88   Ht 5' 4 (1.626 m)   Wt 179 lb 12.8 oz (81.6 kg)   SpO2 96%   BMI 30.86 kg/m    Wt Readings from Last 3 Encounters:  05/24/24 179 lb 12.8 oz (81.6 kg)  05/21/24 177 lb 7.5 oz (80.5 kg)  03/22/24 177 lb 6.4 oz (80.5 kg)     GEN: Pleasant male appearing in no acute  distress NECK: No JVD; No carotid bruits CARDIAC: RRR, no murmurs, rubs, gallops RESPIRATORY:  Clear to auscultation without rales, wheezing or rhonchi  ABDOMEN: Appears non-distended. No obvious abdominal masses. EXTREMITIES: No clubbing or cyanosis. No pitting edema.  Distal pedal pulses are 2+ bilaterally.   Assessment and Plan:   1. Palpitations - His prior monitor showed predominantly normal sinus rhythm with rare PAC's and PVC's with less than 1% burden. He denies any recent palpitations and only takes Lopressor  12.5 mg as needed for palpitations but has not needed to utilize this recently.  2. Mixed hyperlipidemia - LDL was at  53 when checked in 09/2023. Continue current medical therapy with Atorvastatin  20mg  daily and Zetia  10mg  daily.   3. Elevated BP without diagnosis of hypertension - His blood pressure was elevated at the time of ED evaluation but has overall been well-controlled when checked at home. BP is at 116/74 during today's visit. He was provided with a BP log and encouraged to return this in several weeks. If BP were to remain above goal over time, would plan to add Amlodipine.   Signed, Laymon CHRISTELLA Qua, PA-C   "

## 2024-05-24 ENCOUNTER — Ambulatory Visit: Attending: Student | Admitting: Student

## 2024-05-24 ENCOUNTER — Encounter: Payer: Self-pay | Admitting: Student

## 2024-05-24 VITALS — BP 116/74 | HR 88 | Ht 64.0 in | Wt 179.8 lb

## 2024-05-24 DIAGNOSIS — R002 Palpitations: Secondary | ICD-10-CM

## 2024-05-24 DIAGNOSIS — R030 Elevated blood-pressure reading, without diagnosis of hypertension: Secondary | ICD-10-CM

## 2024-05-24 DIAGNOSIS — E782 Mixed hyperlipidemia: Secondary | ICD-10-CM

## 2024-05-24 NOTE — Patient Instructions (Signed)
 Medication Instructions:  Your physician recommends that you continue on your current medications as directed. Please refer to the Current Medication list given to you today.  *If you need a refill on your cardiac medications before your next appointment, please call your pharmacy*  Lab Work: None If you have labs (blood work) drawn today and your tests are completely normal, you will receive your results only by: MyChart Message (if you have MyChart) OR A paper copy in the mail If you have any lab test that is abnormal or we need to change your treatment, we will call you to review the results.  Testing/Procedures: None  Follow-Up: At [facility], you and your health needs are our priority.  As part of our continuing mission to provide you with exceptional heart care, our providers are all part of one team.  This team includes your primary Cardiologist (physician) and Advanced Practice Providers or APPs (Physician Assistants and Nurse Practitioners) who all work together to provide you with the care you need, when you need it.  Your next appointment:   6 month(s)  Provider:   You may see Alvan Carrier, MD or one of the following Advanced Practice Providers on your designated Care Team:   Laymon Qua, PA-C  [place], [place] Olivia Pavy, [place]     We recommend signing up for the patient portal called MyChart.  Sign up information is provided on this After Visit Summary.  MyChart is used to connect with patients for Virtual Visits (Telemedicine).  Patients are able to view lab/test results, encounter notes, upcoming appointments, etc.  Non-urgent messages can be sent to your provider as well.   To learn more about what you can do with MyChart, go to [email or web address].   Other Instructions Thank you for choosing [facility]!   Your physician has requested that you regularly monitor and record your blood pressure readings at home. Please use the  same machine at the same time of day to check your readings and record them to bring to our office in 1 month.

## 2024-05-25 ENCOUNTER — Encounter: Payer: Self-pay | Admitting: Student

## 2024-06-06 ENCOUNTER — Ambulatory Visit: Admitting: Student

## 2024-06-08 ENCOUNTER — Other Ambulatory Visit: Payer: Self-pay

## 2024-07-19 ENCOUNTER — Ambulatory Visit: Admitting: Family Medicine
